# Patient Record
Sex: Male | Born: 1960 | Race: White | Hispanic: No | Marital: Married | Smoking: Never smoker
Health system: Southern US, Community
[De-identification: ages and names within clinical notes are randomized; demographics above are authoritative.]

## PROBLEM LIST (undated history)

## (undated) DIAGNOSIS — F40240 Claustrophobia: Secondary | ICD-10-CM

## (undated) DIAGNOSIS — Z87442 Personal history of urinary calculi: Secondary | ICD-10-CM

## (undated) DIAGNOSIS — K811 Chronic cholecystitis: Secondary | ICD-10-CM

## (undated) DIAGNOSIS — Z973 Presence of spectacles and contact lenses: Secondary | ICD-10-CM

## (undated) DIAGNOSIS — R200 Anesthesia of skin: Secondary | ICD-10-CM

## (undated) HISTORY — PX: CERVICAL SPINE SURGERY: SHX589

## (undated) HISTORY — PX: ROTATOR CUFF REPAIR: SHX139

## (undated) HISTORY — PX: ESOPHAGEAL DILATION: SHX303

---

## 1999-12-18 ENCOUNTER — Ambulatory Visit (HOSPITAL_COMMUNITY): Admission: RE | Admit: 1999-12-18 | Discharge: 1999-12-18 | Payer: Self-pay | Admitting: Gastroenterology

## 1999-12-19 ENCOUNTER — Ambulatory Visit (HOSPITAL_COMMUNITY): Admission: RE | Admit: 1999-12-19 | Discharge: 1999-12-19 | Payer: Self-pay | Admitting: Gastroenterology

## 1999-12-19 ENCOUNTER — Encounter: Payer: Self-pay | Admitting: Gastroenterology

## 2005-08-20 ENCOUNTER — Ambulatory Visit (HOSPITAL_COMMUNITY): Admission: RE | Admit: 2005-08-20 | Discharge: 2005-08-20 | Payer: Self-pay | Admitting: Internal Medicine

## 2005-08-29 ENCOUNTER — Ambulatory Visit (HOSPITAL_COMMUNITY): Admission: RE | Admit: 2005-08-29 | Discharge: 2005-08-29 | Payer: Self-pay | Admitting: Internal Medicine

## 2014-05-06 ENCOUNTER — Other Ambulatory Visit: Payer: Self-pay | Admitting: Orthopaedic Surgery

## 2014-05-07 ENCOUNTER — Other Ambulatory Visit: Payer: Self-pay | Admitting: Orthopaedic Surgery

## 2014-05-07 ENCOUNTER — Ambulatory Visit
Admission: RE | Admit: 2014-05-07 | Discharge: 2014-05-07 | Disposition: A | Discharge status: Home / Self Care | Payer: BC Managed Care – PPO | Source: Ambulatory Visit | Attending: Orthopaedic Surgery | Admitting: Orthopaedic Surgery

## 2014-05-07 VITALS — BP 135/88 | HR 54 | Ht 70.0 in | Wt 190.0 lb

## 2014-05-07 DIAGNOSIS — M5412 Radiculopathy, cervical region: Secondary | ICD-10-CM

## 2014-05-07 DIAGNOSIS — M542 Cervicalgia: Secondary | ICD-10-CM

## 2014-05-07 MED ORDER — DIAZEPAM 5 MG PO TABS
10.0000 mg | ORAL_TABLET | Freq: Once | ORAL | Status: AC
  Administered 2014-05-07: 10 mg via ORAL

## 2014-05-07 MED ORDER — IOHEXOL 300 MG/ML  SOLN
10.0000 mL | Freq: Once | INTRAMUSCULAR | Status: AC | PRN
  Administered 2014-05-07: 10 mL via INTRATHECAL

## 2014-05-07 NOTE — Discharge Instructions (Signed)

## 2016-03-28 ENCOUNTER — Other Ambulatory Visit: Payer: Self-pay | Admitting: Orthopaedic Surgery

## 2016-04-11 NOTE — Pre-Procedure Instructions (Signed)
Corey Phelps  04/11/2016      [facility] - [place] - [address] [address] [place] [address] Phone: [phone] Fax: [phone]    Your procedure is scheduled on Tuesday, August 8th, 2017.  Report to [facility] Admitting at 11:00 A.M.   Call this number if you have problems the morning of surgery:  [phone]   Remember:  Do not eat food or drink liquids after midnight.   Take these medicines the morning of surgery with A SIP OF WATER: None.  7 days prior to surgery, stop taking: Aspirin, NSAIDS, Aleve, Naproxen, Ibuprofen, Advil, Motrin, BC's, Goody's, Fish oil, all herbal medications, and all vitamins.    Do not wear jewelry.  Do not wear lotions, powders, or colognes.    Men may shave face and neck.  Do not bring valuables to the hospital.  [facility] is not responsible for any belongings or valuables.  Contacts, dentures or bridgework may not be worn into surgery.  Leave your suitcase in the car.  After surgery it may be brought to your room.  For patients admitted to the hospital, discharge time will be determined by your treatment team.  Patients discharged the day of surgery will not be allowed to drive home.   Special instructions:  Preparing for Surgery.   Please read over the following fact sheets that you were given. MRSA Information, Incentive Spirometry.   [place]- Preparing For Surgery  Before surgery, you can play an important role. Because skin is not sterile, your skin needs to be as free of germs as possible. You can reduce the number of germs on your skin by washing with CHG (chlorahexidine gluconate) Soap before surgery.  CHG is an antiseptic cleaner which kills germs and bonds with the skin to continue killing germs even after washing.  Please do not use if you have an allergy to CHG or antibacterial soaps. If your skin becomes reddened/irritated stop using  the CHG.  Do not shave (including legs and underarms) for at least 48 hours prior to first CHG shower. It is OK to shave your face.  Please follow these instructions carefully.   1. Shower the NIGHT BEFORE SURGERY and the MORNING OF SURGERY with CHG.   2. If you chose to wash your hair, wash your hair first as usual with your normal shampoo.  3. After you shampoo, rinse your hair and body thoroughly to remove the shampoo.  4. Use CHG as you would any other liquid soap. You can apply CHG directly to the skin and wash gently with a scrungie or a clean washcloth.   5. Apply the CHG Soap to your body ONLY FROM THE NECK DOWN.  Do not use on open wounds or open sores. Avoid contact with your eyes, ears, mouth and genitals (private parts). Wash genitals (private parts) with your normal soap.  6. Wash thoroughly, paying special attention to the area where your surgery will be performed.  7. Thoroughly rinse your body with warm water from the neck down.  8. DO NOT shower/wash with your normal soap after using and rinsing off the CHG Soap.  9. Pat yourself dry with a CLEAN TOWEL.   10. Wear CLEAN PAJAMAS   11. Place CLEAN SHEETS on your bed the night of your first shower and DO NOT SLEEP WITH PETS.  Day of Surgery: Do not apply any deodorants/lotions. Please wear clean clothes  to the hospital/surgery center.

## 2016-04-12 ENCOUNTER — Encounter (HOSPITAL_COMMUNITY): Payer: Self-pay

## 2016-04-12 ENCOUNTER — Ambulatory Visit (HOSPITAL_COMMUNITY)
Admission: RE | Admit: 2016-04-12 | Discharge: 2016-04-12 | Disposition: A | Discharge status: Home / Self Care | Payer: BLUE CROSS/BLUE SHIELD | Source: Ambulatory Visit | Attending: Orthopaedic Surgery | Admitting: Orthopaedic Surgery

## 2016-04-12 ENCOUNTER — Encounter (HOSPITAL_COMMUNITY)
Admission: RE | Admit: 2016-04-12 | Discharge: 2016-04-12 | Disposition: A | Discharge status: Home / Self Care | Payer: BLUE CROSS/BLUE SHIELD | Source: Ambulatory Visit | Attending: Orthopaedic Surgery | Admitting: Orthopaedic Surgery

## 2016-04-12 DIAGNOSIS — Z01812 Encounter for preprocedural laboratory examination: Secondary | ICD-10-CM | POA: Insufficient documentation

## 2016-04-12 DIAGNOSIS — M1612 Unilateral primary osteoarthritis, left hip: Secondary | ICD-10-CM | POA: Insufficient documentation

## 2016-04-12 DIAGNOSIS — Z01818 Encounter for other preprocedural examination: Secondary | ICD-10-CM | POA: Diagnosis present

## 2016-04-12 DIAGNOSIS — Z0183 Encounter for blood typing: Secondary | ICD-10-CM | POA: Insufficient documentation

## 2016-04-12 HISTORY — DX: Personal history of urinary calculi: Z87.442

## 2016-04-12 HISTORY — DX: Claustrophobia: F40.240

## 2016-04-12 HISTORY — DX: Anesthesia of skin: R20.0

## 2016-04-12 HISTORY — DX: Presence of spectacles and contact lenses: Z97.3

## 2016-04-12 LAB — PROTIME-INR
INR: 0.93
Prothrombin Time: 12.5 seconds (ref 11.4–15.2)

## 2016-04-12 LAB — BASIC METABOLIC PANEL
ANION GAP: 8 (ref 5–15)
BUN: 12 mg/dL (ref 6–20)
CHLORIDE: 105 mmol/L (ref 101–111)
CO2: 24 mmol/L (ref 22–32)
Calcium: 9.6 mg/dL (ref 8.9–10.3)
Creatinine, Ser: 1.01 mg/dL (ref 0.61–1.24)
GFR calc Af Amer: >60 mL/min (ref >60)
Glucose, Bld: 96 mg/dL (ref 65–99)
POTASSIUM: 4.4 mmol/L (ref 3.5–5.1)
SODIUM: 137 mmol/L (ref 135–145)

## 2016-04-12 LAB — URINALYSIS, ROUTINE W REFLEX MICROSCOPIC
BILIRUBIN URINE: NEGATIVE
Glucose, UA: NEGATIVE mg/dL
HGB URINE DIPSTICK: NEGATIVE
KETONES UR: NEGATIVE mg/dL
Leukocytes, UA: NEGATIVE
NITRITE: NEGATIVE
Protein, ur: NEGATIVE mg/dL
SPECIFIC GRAVITY, URINE: 1.02 (ref 1.005–1.030)
pH: 5 (ref 5.0–8.0)

## 2016-04-12 LAB — CBC WITH DIFFERENTIAL/PLATELET
BASOS ABS: 0 10*3/uL (ref 0.0–0.1)
Basophils Relative: 0 %
Eosinophils Absolute: 0.5 10*3/uL (ref 0.0–0.7)
Eosinophils Relative: 6 %
HEMATOCRIT: 47.1 % (ref 39.0–52.0)
HEMOGLOBIN: 15.6 g/dL (ref 13.0–17.0)
LYMPHS ABS: 1.3 10*3/uL (ref 0.7–4.0)
LYMPHS PCT: 15 %
MCH: 31.1 pg (ref 26.0–34.0)
MCHC: 33.1 g/dL (ref 30.0–36.0)
MCV: 94 fL (ref 78.0–100.0)
Monocytes Absolute: 0.7 10*3/uL (ref 0.1–1.0)
Monocytes Relative: 9 %
NEUTROS ABS: 5.8 10*3/uL (ref 1.7–7.7)
Neutrophils Relative %: 70 %
PLATELETS: 209 10*3/uL (ref 150–400)
RBC: 5.01 MIL/uL (ref 4.22–5.81)
RDW: 12.7 % (ref 11.5–15.5)
WBC: 8.3 10*3/uL (ref 4.0–10.5)

## 2016-04-12 LAB — SURGICAL PCR SCREEN
MRSA, PCR: NEGATIVE
STAPHYLOCOCCUS AUREUS: POSITIVE — AB

## 2016-04-12 LAB — TYPE AND SCREEN
ABO/RH(D): O POS
ANTIBODY SCREEN: NEGATIVE

## 2016-04-12 LAB — APTT: APTT: 30 s (ref 24–36)

## 2016-04-12 LAB — ABO/RH: ABO/RH(D): O POS

## 2016-04-12 NOTE — Progress Notes (Signed)
PCP - pt. Denies a PCP at this time and states that he goes to Urgent Care at [facility] if needed Cardiologist - denies  EKG - 04/12/16 CXR - 04/12/16  Echo/stress test/Cardiac Cath - denies  Patient denies chest pain and shortness of breath at PAT appointment.

## 2016-04-12 NOTE — Progress Notes (Signed)
Patient notified of positive PCR swab and verbalized understanding.  Prescription called to [facility].

## 2016-04-16 NOTE — H&P (Signed)
TOTAL HIP ADMISSION H&P  Patient is admitted for left total hip arthroplasty.  Subjective:  Chief Complaint: left hip pain  HPI: Corey Phelps, 55 y.o. male, has a history of pain and functional disability in the left hip(s) due to arthritis and patient has failed non-surgical conservative treatments for greater than 12 weeks to include NSAID's and/or analgesics, corticosteriod injections, flexibility and strengthening excercises, use of assistive devices, weight reduction as appropriate and activity modification.  Onset of symptoms was gradual starting 5 years ago with gradually worsening course since that time.The patient noted no past surgery on the left hip(s).  Patient currently rates pain in the left hip at 10 out of 10 with activity. Patient has night pain, worsening of pain with activity and weight bearing, trendelenberg gait, pain that interfers with activities of daily living and crepitus. Patient has evidence of subchondral cysts, subchondral sclerosis, periarticular osteophytes and joint space narrowing by imaging studies. This condition presents safety issues increasing the risk of falls.  There is no current active infection.  There are no active problems to display for this patient.  Past Medical History:  Diagnosis Date  . Claustrophobia   . History of kidney stones   . Numbness    left leg to toes   . Wears glasses     Past Surgical History:  Procedure Laterality Date  . CERVICAL SPINE SURGERY    . ESOPHAGEAL DILATION    . ROTATOR CUFF REPAIR Right     No prescriptions prior to admission.   Allergies  Allergen Reactions  . Erythromycin Hives and Rash    Social History  Substance Use Topics  . Smoking status: Never Smoker  . Smokeless tobacco: Current User    Types: Snuff, Chew  . Alcohol use 1.2 - 2.4 oz/week    2 - 4 Cans of beer per week     Comment: occas.     No family history on file.   Review of Systems  Musculoskeletal: Positive for joint pain.        Left hip    Objective:  Physical Exam  Constitutional: He is oriented to person, place, and time. He appears well-developed and well-nourished.  HENT:  Head: Normocephalic and atraumatic.  Eyes: Pupils are equal, round, and reactive to light.  Neck: Normal range of motion.  Cardiovascular: Normal rate and regular rhythm.   Respiratory: Effort normal.  GI: Soft.  Musculoskeletal:  Left hip motion is good though extremely painful in internal rotation. Leg lengths are roughly equal. He walks with a mildly altered gait. Sensation and motor function are intact in his feet with palpable pulses on both sides. Straight leg raise is negative.   Neurological: He is alert and oriented to person, place, and time.  Skin: Skin is warm and dry.  Psychiatric: He has a normal mood and affect. His behavior is normal. Judgment and thought content normal.    Vital signs in last 24 hours:    Labs:   Estimated body mass index is 28.69 kg/m as calculated from the following:   Height as of 04/12/16:  (1.778 m).   Weight as of 04/12/16: 90.7 kg (199 lb 15.3 oz).   Imaging Review Plain radiographs demonstrate severe degenerative joint disease of the left hip(s). The bone quality appears to be good for age and reported activity level.  Assessment/Plan:  End stage primary arthritis, left hip(s)  The patient history, physical examination, clinical judgement of the provider and imaging studies are consistent  with end stage degenerative joint disease of the left hip(s) and total hip arthroplasty is deemed medically necessary. The treatment options including medical management, injection therapy, arthroscopy and arthroplasty were discussed at length. The risks and benefits of total hip arthroplasty were presented and reviewed. The risks due to aseptic loosening, infection, stiffness, dislocation/subluxation,  thromboembolic complications and other imponderables were discussed.  The patient  acknowledged the explanation, agreed to proceed with the plan and consent was signed. Patient is being admitted for inpatient treatment for surgery, pain control, PT, OT, prophylactic antibiotics, VTE prophylaxis, progressive ambulation and ADL's and discharge planning.The patient is planning to be discharged home with home health services

## 2016-04-24 ENCOUNTER — Inpatient Hospital Stay (HOSPITAL_COMMUNITY): Payer: BLUE CROSS/BLUE SHIELD | Admitting: Certified Registered"

## 2016-04-24 ENCOUNTER — Inpatient Hospital Stay (HOSPITAL_COMMUNITY): Payer: BLUE CROSS/BLUE SHIELD

## 2016-04-24 ENCOUNTER — Inpatient Hospital Stay (HOSPITAL_COMMUNITY)
Admission: RE | Admit: 2016-04-24 | Discharge: 2016-04-25 | DRG: 470 | Disposition: A | Discharge status: Home Health | Payer: BLUE CROSS/BLUE SHIELD | Source: Ambulatory Visit | Attending: Orthopaedic Surgery | Admitting: Orthopaedic Surgery

## 2016-04-24 ENCOUNTER — Encounter (HOSPITAL_COMMUNITY): Payer: Self-pay | Admitting: Urology

## 2016-04-24 ENCOUNTER — Encounter (HOSPITAL_COMMUNITY)
Admission: RE | Disposition: A | Discharge status: Home Health | Payer: Self-pay | Source: Ambulatory Visit | Attending: Orthopaedic Surgery

## 2016-04-24 DIAGNOSIS — M25552 Pain in left hip: Secondary | ICD-10-CM | POA: Diagnosis present

## 2016-04-24 DIAGNOSIS — F17220 Nicotine dependence, chewing tobacco, uncomplicated: Secondary | ICD-10-CM | POA: Diagnosis present

## 2016-04-24 DIAGNOSIS — M1612 Unilateral primary osteoarthritis, left hip: Secondary | ICD-10-CM | POA: Diagnosis present

## 2016-04-24 DIAGNOSIS — Z419 Encounter for procedure for purposes other than remedying health state, unspecified: Secondary | ICD-10-CM

## 2016-04-24 HISTORY — PX: TOTAL HIP ARTHROPLASTY: SHX124

## 2016-04-24 SURGERY — ARTHROPLASTY, HIP, TOTAL, ANTERIOR APPROACH
Anesthesia: Spinal | Site: Hip | Laterality: Left

## 2016-04-24 MED ORDER — ACETAMINOPHEN 325 MG PO TABS: 325.0000 mg | ORAL_TABLET | ORAL | Status: DC | PRN

## 2016-04-24 MED ORDER — METRONIDAZOLE 0.75 % EX CREA: 1.0000 "application " | TOPICAL_CREAM | Freq: Every day | CUTANEOUS | Status: DC | PRN

## 2016-04-24 MED ORDER — OXYCODONE HCL 5 MG PO TABS
5.0000 mg | ORAL_TABLET | Freq: Once | ORAL | Status: AC | PRN
  Administered 2016-04-24: 5 mg via ORAL

## 2016-04-24 MED ORDER — ONDANSETRON HCL 4 MG/2ML IJ SOLN
INTRAMUSCULAR | Status: DC | PRN
  Administered 2016-04-24: 4 mg via INTRAVENOUS

## 2016-04-24 MED ORDER — METHOCARBAMOL 1000 MG/10ML IJ SOLN
500.0000 mg | Freq: Four times a day (QID) | INTRAVENOUS | Status: DC | PRN
  Filled 2016-04-24: qty 5

## 2016-04-24 MED ORDER — BUPIVACAINE LIPOSOME 1.3 % IJ SUSP
20.0000 mL | Freq: Once | INTRAMUSCULAR | Status: DC
  Filled 2016-04-24: qty 20

## 2016-04-24 MED ORDER — OXYCODONE HCL 5 MG/5ML PO SOLN: 5.0000 mg | Freq: Once | ORAL | Status: AC | PRN

## 2016-04-24 MED ORDER — PHENOL 1.4 % MT LIQD: 1.0000 | OROMUCOSAL | Status: DC | PRN

## 2016-04-24 MED ORDER — LACTATED RINGERS IV SOLN
INTRAVENOUS | Status: DC
  Administered 2016-04-24: 18:00:00 via INTRAVENOUS

## 2016-04-24 MED ORDER — HYDROCODONE-ACETAMINOPHEN 5-325 MG PO TABS
1.0000 | ORAL_TABLET | ORAL | Status: DC | PRN
  Administered 2016-04-24 – 2016-04-25 (×5): 2 via ORAL
  Filled 2016-04-24 (×5): qty 2

## 2016-04-24 MED ORDER — DOCUSATE SODIUM 100 MG PO CAPS
100.0000 mg | ORAL_CAPSULE | Freq: Two times a day (BID) | ORAL | Status: DC
  Administered 2016-04-24 – 2016-04-25 (×2): 100 mg via ORAL
  Filled 2016-04-24 (×2): qty 1

## 2016-04-24 MED ORDER — ACETAMINOPHEN 650 MG RE SUPP
650.0000 mg | Freq: Four times a day (QID) | RECTAL | Status: DC | PRN
Start: 2016-04-24 — End: 2016-04-25

## 2016-04-24 MED ORDER — TRANEXAMIC ACID 1000 MG/10ML IV SOLN
1000.0000 mg | INTRAVENOUS | Status: AC
  Administered 2016-04-24: 1000 mg via INTRAVENOUS
  Filled 2016-04-24: qty 10

## 2016-04-24 MED ORDER — FENTANYL CITRATE (PF) 250 MCG/5ML IJ SOLN
INTRAMUSCULAR | Status: AC
  Filled 2016-04-24: qty 5

## 2016-04-24 MED ORDER — EPHEDRINE SULFATE 50 MG/ML IJ SOLN
INTRAMUSCULAR | Status: DC | PRN
Start: 2016-04-24 — End: 2016-04-24
  Administered 2016-04-24: 10 mg via INTRAVENOUS

## 2016-04-24 MED ORDER — ONDANSETRON HCL 4 MG PO TABS: 4.0000 mg | ORAL_TABLET | Freq: Four times a day (QID) | ORAL | Status: DC | PRN

## 2016-04-24 MED ORDER — PROPOFOL 10 MG/ML IV BOLUS
INTRAVENOUS | Status: DC | PRN
  Administered 2016-04-24: 10 mg via INTRAVENOUS

## 2016-04-24 MED ORDER — PROPOFOL 10 MG/ML IV BOLUS
INTRAVENOUS | Status: AC
  Filled 2016-04-24: qty 20

## 2016-04-24 MED ORDER — LACTATED RINGERS IV SOLN: INTRAVENOUS | Status: DC

## 2016-04-24 MED ORDER — CEFAZOLIN SODIUM-DEXTROSE 2-4 GM/100ML-% IV SOLN
2.0000 g | Freq: Four times a day (QID) | INTRAVENOUS | Status: AC
  Administered 2016-04-24 – 2016-04-25 (×2): 2 g via INTRAVENOUS
  Filled 2016-04-24 (×2): qty 100

## 2016-04-24 MED ORDER — HYDROMORPHONE HCL 1 MG/ML IJ SOLN: 0.2500 mg | INTRAMUSCULAR | Status: DC | PRN

## 2016-04-24 MED ORDER — ALUM & MAG HYDROXIDE-SIMETH 200-200-20 MG/5ML PO SUSP: 30.0000 mL | ORAL | Status: DC | PRN

## 2016-04-24 MED ORDER — TRANEXAMIC ACID 1000 MG/10ML IV SOLN
INTRAVENOUS | Status: DC | PRN
  Administered 2016-04-24: 2000 mg via TOPICAL

## 2016-04-24 MED ORDER — MIDAZOLAM HCL 2 MG/2ML IJ SOLN
INTRAMUSCULAR | Status: AC
  Filled 2016-04-24: qty 2

## 2016-04-24 MED ORDER — MENTHOL 3 MG MT LOZG: 1.0000 | LOZENGE | OROMUCOSAL | Status: DC | PRN

## 2016-04-24 MED ORDER — EPHEDRINE 5 MG/ML INJ
INTRAVENOUS | Status: AC
  Filled 2016-04-24: qty 10

## 2016-04-24 MED ORDER — DEXAMETHASONE SODIUM PHOSPHATE 10 MG/ML IJ SOLN
INTRAMUSCULAR | Status: AC
  Filled 2016-04-24: qty 1

## 2016-04-24 MED ORDER — PHENYLEPHRINE HCL 10 MG/ML IJ SOLN
INTRAVENOUS | Status: DC | PRN
  Administered 2016-04-24: 15 ug/min via INTRAVENOUS

## 2016-04-24 MED ORDER — CEFAZOLIN SODIUM-DEXTROSE 2-4 GM/100ML-% IV SOLN
2.0000 g | INTRAVENOUS | Status: AC
  Administered 2016-04-24: 2 g via INTRAVENOUS

## 2016-04-24 MED ORDER — BUPIVACAINE-EPINEPHRINE (PF) 0.25% -1:200000 IJ SOLN
INTRAMUSCULAR | Status: AC
  Filled 2016-04-24: qty 30

## 2016-04-24 MED ORDER — ASPIRIN EC 325 MG PO TBEC
325.0000 mg | DELAYED_RELEASE_TABLET | Freq: Two times a day (BID) | ORAL | Status: DC
  Administered 2016-04-25: 325 mg via ORAL
  Filled 2016-04-24: qty 1

## 2016-04-24 MED ORDER — CEFAZOLIN SODIUM-DEXTROSE 2-4 GM/100ML-% IV SOLN
INTRAVENOUS | Status: AC
  Filled 2016-04-24: qty 100

## 2016-04-24 MED ORDER — LIDOCAINE 2% (20 MG/ML) 5 ML SYRINGE
INTRAMUSCULAR | Status: AC
  Filled 2016-04-24: qty 5

## 2016-04-24 MED ORDER — BUPIVACAINE HCL (PF) 0.5 % IJ SOLN
INTRAMUSCULAR | Status: AC
  Filled 2016-04-24: qty 30

## 2016-04-24 MED ORDER — MIDAZOLAM HCL 5 MG/5ML IJ SOLN
INTRAMUSCULAR | Status: DC | PRN
  Administered 2016-04-24: 2 mg via INTRAVENOUS
  Administered 2016-04-24 (×2): 1 mg via INTRAVENOUS

## 2016-04-24 MED ORDER — METHOCARBAMOL 500 MG PO TABS
500.0000 mg | ORAL_TABLET | Freq: Four times a day (QID) | ORAL | Status: DC | PRN
  Administered 2016-04-24: 500 mg via ORAL
  Filled 2016-04-24: qty 1

## 2016-04-24 MED ORDER — BUPIVACAINE HCL (PF) 0.25 % IJ SOLN
INTRAMUSCULAR | Status: AC
  Filled 2016-04-24: qty 30

## 2016-04-24 MED ORDER — BUPIVACAINE-EPINEPHRINE 0.25% -1:200000 IJ SOLN
INTRAMUSCULAR | Status: DC | PRN
  Administered 2016-04-24: 20 mL

## 2016-04-24 MED ORDER — ZOLPIDEM TARTRATE 5 MG PO TABS
5.0000 mg | ORAL_TABLET | Freq: Every evening | ORAL | Status: DC | PRN
  Administered 2016-04-25: 5 mg via ORAL
  Filled 2016-04-24: qty 1

## 2016-04-24 MED ORDER — CHLORHEXIDINE GLUCONATE 4 % EX LIQD: 60.0000 mL | Freq: Once | CUTANEOUS | Status: DC

## 2016-04-24 MED ORDER — ACETAMINOPHEN 160 MG/5ML PO SOLN
325.0000 mg | ORAL | Status: DC | PRN
  Filled 2016-04-24: qty 20.3

## 2016-04-24 MED ORDER — ONDANSETRON HCL 4 MG/2ML IJ SOLN
INTRAMUSCULAR | Status: AC
  Filled 2016-04-24: qty 2

## 2016-04-24 MED ORDER — OXYCODONE HCL 5 MG PO TABS
ORAL_TABLET | ORAL | Status: AC
  Filled 2016-04-24: qty 1

## 2016-04-24 MED ORDER — DEXAMETHASONE SODIUM PHOSPHATE 10 MG/ML IJ SOLN
INTRAMUSCULAR | Status: DC | PRN
  Administered 2016-04-24: 10 mg via INTRAVENOUS

## 2016-04-24 MED ORDER — HYDROMORPHONE HCL 1 MG/ML IJ SOLN: 0.5000 mg | INTRAMUSCULAR | Status: DC | PRN

## 2016-04-24 MED ORDER — 0.9 % SODIUM CHLORIDE (POUR BTL) OPTIME
TOPICAL | Status: DC | PRN
  Administered 2016-04-24: 1000 mL

## 2016-04-24 MED ORDER — ACETAMINOPHEN 325 MG PO TABS: 650.0000 mg | ORAL_TABLET | Freq: Four times a day (QID) | ORAL | Status: DC | PRN

## 2016-04-24 MED ORDER — PROPOFOL 500 MG/50ML IV EMUL
INTRAVENOUS | Status: DC | PRN
  Administered 2016-04-24: 65 ug/kg/min via INTRAVENOUS

## 2016-04-24 MED ORDER — BUPIVACAINE HCL (PF) 0.5 % IJ SOLN
INTRAMUSCULAR | Status: DC | PRN
  Administered 2016-04-24: 3 mL

## 2016-04-24 MED ORDER — LACTATED RINGERS IV SOLN
INTRAVENOUS | Status: DC
  Administered 2016-04-24 (×2): via INTRAVENOUS

## 2016-04-24 MED ORDER — FENTANYL CITRATE (PF) 100 MCG/2ML IJ SOLN
INTRAMUSCULAR | Status: DC | PRN
  Administered 2016-04-24: 100 ug via INTRAVENOUS

## 2016-04-24 MED ORDER — BUPIVACAINE LIPOSOME 1.3 % IJ SUSP
INTRAMUSCULAR | Status: DC | PRN
  Administered 2016-04-24: 20 mL

## 2016-04-24 MED ORDER — TRANEXAMIC ACID 1000 MG/10ML IV SOLN
2000.0000 mg | Freq: Once | INTRAVENOUS | Status: DC
  Filled 2016-04-24: qty 20

## 2016-04-24 MED ORDER — DIPHENHYDRAMINE HCL 12.5 MG/5ML PO ELIX: 12.5000 mg | ORAL_SOLUTION | ORAL | Status: DC | PRN

## 2016-04-24 MED ORDER — BISACODYL 5 MG PO TBEC: 5.0000 mg | DELAYED_RELEASE_TABLET | Freq: Every day | ORAL | Status: DC | PRN

## 2016-04-24 MED ORDER — ONDANSETRON HCL 4 MG/2ML IJ SOLN
4.0000 mg | Freq: Four times a day (QID) | INTRAMUSCULAR | Status: DC | PRN
  Filled 2016-04-24: qty 2

## 2016-04-24 MED ORDER — BUPIVACAINE HCL (PF) 0.5 % IJ SOLN
INTRAMUSCULAR | Status: DC | PRN
  Administered 2016-04-24: 3 mL via INTRATHECAL

## 2016-04-24 SURGICAL SUPPLY — 52 items
BAG DECANTER FOR FLEXI CONT (MISCELLANEOUS) ×3 IMPLANT
BENZOIN TINCTURE PRP APPL 2/3 (GAUZE/BANDAGES/DRESSINGS) ×3 IMPLANT
BLADE SAW SGTL 18X1.27X75 (BLADE) ×2 IMPLANT
BLADE SAW SGTL 18X1.27X75MM (BLADE) ×1
BLADE SURG ROTATE 9660 (MISCELLANEOUS) IMPLANT
CAPT HIP TOTAL 2 ×3 IMPLANT
CELLS DAT CNTRL 66122 CELL SVR (MISCELLANEOUS) ×1 IMPLANT
CLOSURE WOUND 1/2 X4 (GAUZE/BANDAGES/DRESSINGS) ×1
COVER PERINEAL POST (MISCELLANEOUS) ×3 IMPLANT
COVER SURGICAL LIGHT HANDLE (MISCELLANEOUS) ×3 IMPLANT
DRAPE C-ARM 42X72 X-RAY (DRAPES) ×3 IMPLANT
DRAPE IMP U-DRAPE 54X76 (DRAPES) ×3 IMPLANT
DRAPE STERI IOBAN 125X83 (DRAPES) ×3 IMPLANT
DRAPE U-SHAPE 47X51 STRL (DRAPES) ×9 IMPLANT
DRSG AQUACEL AG ADV 3.5X10 (GAUZE/BANDAGES/DRESSINGS) ×3 IMPLANT
DURAPREP 26ML APPLICATOR (WOUND CARE) ×3 IMPLANT
ELECT BLADE 4.0 EZ CLEAN MEGAD (MISCELLANEOUS)
ELECT CAUTERY BLADE 6.4 (BLADE) ×3 IMPLANT
ELECT REM PT RETURN 9FT ADLT (ELECTROSURGICAL) ×3
ELECTRODE BLDE 4.0 EZ CLN MEGD (MISCELLANEOUS) IMPLANT
ELECTRODE REM PT RTRN 9FT ADLT (ELECTROSURGICAL) ×1 IMPLANT
FACESHIELD WRAPAROUND (MASK) ×6 IMPLANT
GLOVE BIO SURGEON STRL SZ8 (GLOVE) ×6 IMPLANT
GLOVE BIOGEL PI IND STRL 8 (GLOVE) ×2 IMPLANT
GLOVE BIOGEL PI INDICATOR 8 (GLOVE) ×4
GOWN STRL REUS W/ TWL LRG LVL3 (GOWN DISPOSABLE) ×2 IMPLANT
GOWN STRL REUS W/ TWL XL LVL3 (GOWN DISPOSABLE) ×2 IMPLANT
GOWN STRL REUS W/TWL LRG LVL3 (GOWN DISPOSABLE) ×4
GOWN STRL REUS W/TWL XL LVL3 (GOWN DISPOSABLE) ×4
KIT BASIN OR (CUSTOM PROCEDURE TRAY) ×3 IMPLANT
KIT ROOM TURNOVER OR (KITS) ×3 IMPLANT
MANIFOLD NEPTUNE II (INSTRUMENTS) ×3 IMPLANT
NEEDLE HYPO 22GX1.5 SAFETY (NEEDLE) ×3 IMPLANT
NS IRRIG 1000ML POUR BTL (IV SOLUTION) ×3 IMPLANT
PACK TOTAL JOINT (CUSTOM PROCEDURE TRAY) ×3 IMPLANT
PAD ARMBOARD 7.5X6 YLW CONV (MISCELLANEOUS) ×6 IMPLANT
RTRCTR WOUND ALEXIS 18CM MED (MISCELLANEOUS) ×3
STAPLER VISISTAT 35W (STAPLE) IMPLANT
STRIP CLOSURE SKIN 1/2X4 (GAUZE/BANDAGES/DRESSINGS) ×2 IMPLANT
SUT ETHIBOND NAB CT1 #1 30IN (SUTURE) ×9 IMPLANT
SUT VIC AB 0 CT1 27 (SUTURE)
SUT VIC AB 0 CT1 27XBRD ANBCTR (SUTURE) IMPLANT
SUT VIC AB 1 CT1 27 (SUTURE) ×2
SUT VIC AB 1 CT1 27XBRD ANBCTR (SUTURE) ×1 IMPLANT
SUT VIC AB 2-0 CT1 27 (SUTURE) ×2
SUT VIC AB 2-0 CT1 TAPERPNT 27 (SUTURE) ×1 IMPLANT
SUT VLOC 180 0 24IN GS25 (SUTURE) ×3 IMPLANT
SYR 50ML LL SCALE MARK (SYRINGE) ×3 IMPLANT
TOWEL OR 17X24 6PK STRL BLUE (TOWEL DISPOSABLE) ×3 IMPLANT
TOWEL OR 17X26 10 PK STRL BLUE (TOWEL DISPOSABLE) ×6 IMPLANT
TRAY FOLEY CATH 14FR (SET/KITS/TRAYS/PACK) IMPLANT
WATER STERILE IRR 1000ML POUR (IV SOLUTION) ×6 IMPLANT

## 2016-04-24 NOTE — Interval H&P Note (Signed)
History and Physical Interval Note:  04/24/2016 9:50 AM  Corey Phelps  has presented today for surgery, with the diagnosis of LEFT HIP DEGENERATIVE JOINT DISEASE  The various methods of treatment have been discussed with the patient and family. After consideration of risks, benefits and other options for treatment, the patient has consented to  Procedure(s) with comments: TOTAL HIP ARTHROPLASTY ANTERIOR APPROACH (Left) - Request a First Assist as a surgical intervention .  The patient's history has been reviewed, patient examined, no change in status, stable for surgery.  I have reviewed the patient's chart and labs.  Questions were answered to the patient's satisfaction.     Taeveon Keesling G

## 2016-04-24 NOTE — Op Note (Signed)
PRE-OP DIAGNOSIS:  LEFT HIP DEGENERATIVE JOINT DISEASE POST-OP DIAGNOSIS: same PROCEDURE:  LEFT TOTAL HIP ARTHROPLASTY ANTERIOR APPROACH ANESTHESIA:  Spinal and MAC SURGEON:  Marcene CorningPeter Lyvia Mondesir MD ASSISTANT:  Garwin BrothersKarla Bethune RNFA   INDICATIONS FOR PROCEDURE:  The patient is a 55 y.o. male with a long history of a painful hip.  This has persisted despite multiple conservative measures.  The patient has persisted with pain and dysfunction making rest and activity difficult.  A total hip replacement is offered as surgical treatment.  Informed operative consent was obtained after discussion of possible complications including reaction to anesthesia, infection, neurovascular injury, dislocation, DVT, PE, and death.  The importance of the postoperative rehab program to optimize result was stressed with the patient.  SUMMARY OF FINDINGS AND PROCEDURE:  Under general anesthesia through a anterior approach an the Hana table a left THR was performed.  The patient had severe degenerative change and excellent bone quality.  We used DePuy components to replace the hip and these were size KA 13 Corail femur capped with a +8.5 36 mm ceramic hip ball.  On the acetabular side we used a size 52 Gription shell with a plus 0 neutral polyethylene liner.  We did use a hole eliminator.  Garwin BrothersKarla Bethune RNFA assisted throughout the procedure.  She also closed simultaneously to help minimize OR time.  I used fluoroscopy throughout the case to check position of components and leg lengths and read all these views myself.  DESCRIPTION OF PROCEDURE:  The patient was taken to the OR suite where general anesthetic was applied.  The patient was then positioned on the Hana table supine.  All bony prominences were appropriately padded.  Prep and drape was then performed in normal sterile fashion.  The patient was given kefzol preoperative antibiotic and an appropriate time out was performed.  We then took an anterior approach to the left hip.   Dissection was taken through adipose to the tensor fascia lata fascia.  This structure was incised longitudinally and we dissected in the intermuscular interval just medial to this muscle.  Cobra retractors were placed superior and inferior to the femoral neck superficial to the capsule.  A capsular incision was then made and the retractors were placed along the femoral neck.  Xray was brought in to get a good level for the femoral neck cut which was made with an oscillating saw and osteotome.  The femoral head was removed with a corkscrew.  The acetabulum was exposed and some labral tissues were excised. Reaming was taken to the inside wall of the pelvis and sequentially up to 1 mm smaller than the actual component.  A trial of components was done and then the aforementioned acetabular shell was placed in appropriate tilt and anteversion confirmed by fluoroscopy. The liner was placed along with the hole eliminator and attention was turned to the femur.  The leg was brought down and over into adduction and the elevator bar was used to raise the femur up gently in the wound.  The piriformis was released with care taken to preserve the obturator internus attachment and all of the posterior capsule. The femur was reamed and then broached to the appropriate size.  A trial reduction was done and the aforementioned head and neck assembly gave us the best stability in extension with external rotation.  Leg lengths were felt to be about equal by fluoroscopic exam.  The trial components were removed and the wound irrigated.  We then placed the femoral component in  appropriate anteversion.  The head was applied to a dry stem neck and the hip again reduced.  It was again stable in the aforementioned position.  The would was irrigated again followed by re-approximation of anterior capsule with ethibond suture. Tensor fascia was repaired with V-loc suture  followed by deep closure with #O and #2 undyed vicryl.  Skin was closed  with subQ stitch and steristrips followed by a sterile dressing.  EBL and IOF can be obtained from anesthesia records.  DISPOSITION:  The patient was extubated in the OR and taken to PACU in stable condition to be admitted to the Orthopedic Surgery for appropriate post-op care to include perioperative antibiotics and DVT prophylaxis.

## 2016-04-24 NOTE — Anesthesia Procedure Notes (Signed)
Procedure Name: MAC Date/Time: 04/24/2016 10:26 AM Performed by: Doyce LooseHOFFMAN, Vincy Feliz ANN Pre-anesthesia Checklist: Patient identified, Emergency Drugs available, Suction available and Patient being monitored Patient Re-evaluated:Patient Re-evaluated prior to inductionOxygen Delivery Method: Nasal cannula

## 2016-04-24 NOTE — Transfer of Care (Signed)
Immediate Anesthesia Transfer of Care Note  Patient: Corey Phelps  Procedure(s) Performed: Procedure(s): LEFT TOTAL HIP ARTHROPLASTY ANTERIOR APPROACH (Left)  Patient Location: PACU  Anesthesia Type:Spinal  Level of Consciousness: sedated  Airway & Oxygen Therapy: Patient Spontanous Breathing and Patient connected to nasal cannula oxygen  Post-op Assessment: Report given to RN and Post -op Vital signs reviewed and stable  Post vital signs: Reviewed and stable  Last Vitals:  Vitals:   04/24/16 0929  BP: (!) 133/97  Pulse: 61  Resp: 18  Temp: 36.6 C    Last Pain:  Vitals:   04/24/16 0929  TempSrc: Oral  PainSc:          Complications: No apparent anesthesia complications

## 2016-04-24 NOTE — Anesthesia Preprocedure Evaluation (Signed)
Anesthesia Evaluation  Patient identified by MRN, date of birth, ID band Patient awake    Reviewed: Allergy & Precautions, NPO status , Patient's Chart, lab work & pertinent test results  History of Anesthesia Complications Negative for: history of anesthetic complications  Airway Mallampati: II  TM Distance: >3 FB Neck ROM: Full    Dental  (+) Teeth Intact   Pulmonary neg pulmonary ROS,    breath sounds clear to auscultation       Cardiovascular negative cardio ROS   Rhythm:Regular     Neuro/Psych PSYCHIATRIC DISORDERS Anxiety negative neurological ROS     GI/Hepatic negative GI ROS, Neg liver ROS,   Endo/Other  negative endocrine ROS  Renal/GU negative Renal ROS     Musculoskeletal   Abdominal   Peds  Hematology negative hematology ROS (+)   Anesthesia Other Findings   Reproductive/Obstetrics                             Anesthesia Physical Anesthesia Plan  ASA: I  Anesthesia Plan: Spinal   Post-op Pain Management:    Induction: Intravenous  Airway Management Planned: Natural Airway, Nasal Cannula and Simple Face Mask  Additional Equipment: None  Intra-op Plan:   Post-operative Plan:   Informed Consent: I have reviewed the patients History and Physical, chart, labs and discussed the procedure including the risks, benefits and alternatives for the proposed anesthesia with the patient or authorized representative who has indicated his/her understanding and acceptance.   Dental advisory given  Plan Discussed with: CRNA and Surgeon  Anesthesia Plan Comments:         Anesthesia Quick Evaluation

## 2016-04-24 NOTE — Evaluation (Signed)
Physical Therapy Evaluation Patient Details Name: Corey Phelps MRN: [id number] DOB: [birth year] Today's Date: 04/24/2016   History of Present Illness  55 y.o. male admitted to [facility] on 04/24/16 for elective L THA.  Pt with significant PMHx of numbness in left leg to toes, claustrophobia, R RTC repair, and cervical spine surgery.    Clinical Impression  Pt is POD #0 and moving well.  He was able to get up to chair, but unable to safely walk yet as his spinal has not completely worn off yet.  Pt will likely progress well.   PT to follow acutely for deficits listed below.       Follow Up Recommendations Home health PT;Supervision for mobility/OOB    Equipment Recommendations  None recommended by PT    Recommendations for Other Services   NA    Precautions / Restrictions Restrictions Weight Bearing Restrictions: Yes LLE Weight Bearing: Weight bearing as tolerated      Mobility  Bed Mobility Overal bed mobility: Needs Assistance Bed Mobility: Supine to Sit     Supine to sit: HOB elevated;Min assist     General bed mobility comments: Min assist to help progress left leg over EOB. Pt using railing for leverage.   Transfers Overall transfer level: Needs assistance Equipment used: Rolling walker (2 wheeled) Transfers: Sit to/from [employer] Sit to Stand: Min assist   Squat pivot transfers: Min assist     General transfer comment: Min assist to block right leg and stabilize RW verbal cues for safe hand placement.  knee blocked due to pt not completely worn off his spinal.  Pt was able to stabilize against the bed and pull his shorts up.  PT did not feel safe with him transferring with RW yet.  Squat pivot was safer to get to the recliner chair tonight and pt did this easily with min assit for safety to ensure he made it to the recliner chair.          Balance Overall balance assessment: Needs assistance Sitting-balance support: Feet supported;No upper  extremity supported Sitting balance-Leahy Scale: Good     Standing balance support: Bilateral upper extremity supported Standing balance-Leahy Scale: Poor                               Pertinent Vitals/Pain Pain Assessment: 0-10 Pain Score: 5  Pain Location: left hip when trying to move Pain Descriptors / Indicators:  (pulling) Pain Intervention(s): Limited activity within patient's tolerance;Monitored during session;Repositioned    Home Living Family/patient expects to be discharged to:: Private residence Living Arrangements: Spouse/significant other Available Help at Discharge: Family;Available 24 hours/day Type of Home: House Home Access: Stairs to enter Entrance Stairs-Rails: Right;Left;Can reach both Entrance Stairs-Number of Steps: 4 Home Layout: One level Home Equipment: Walker - 2 wheels;Cane - single point;Bedside commode;Shower seat - built in;Wheelchair - manual      Prior Function Level of Independence: Independent         Comments: works, farming     Higher education careers adviser   Dominant Hand: Right    Extremity/Trunk Assessment   Upper Extremity Assessment: Overall WFL for tasks assessed           Lower Extremity Assessment: RLE deficits/detail;LLE deficits/detail RLE Deficits / Details: Pt still with numbness and weakness in bil legs from surgery.  I anticipate he will get full strength back, but currently trace ankle PF/DF, 3-/5 knee, 3-/5 hip LLE Deficits /  Details: pt with numbness and weakness from surgery, pt with trace ankle PF/DF, 2/5 knee, 2/5 hip  Cervical / Trunk Assessment: Other exceptions  Communication   Communication: No difficulties  Cognition Arousal/Alertness: Awake/alert Behavior During Therapy: WFL for tasks assessed/performed Overall Cognitive Status: Within Functional Limits for tasks assessed                         Exercises Total Joint Exercises Ankle Circles/Pumps: AAROM;Both;20 reps Other  Exercises Other Exercises: IS x5 reps, verbal cues to slow speed, pt able to get to the top of the spirometer.       Assessment/Plan    PT Assessment Patient needs continued PT services  PT Diagnosis Difficulty walking;Abnormality of gait;Generalized weakness;Acute pain   PT Problem List Decreased strength;Decreased range of motion;Decreased activity tolerance;Decreased balance;Decreased mobility;Decreased knowledge of use of DME;Pain  PT Treatment Interventions DME instruction;Gait training;Stair training;Functional mobility training;Therapeutic activities;Therapeutic exercise;Balance training;Neuromuscular re-education;Cognitive remediation;Patient/family education;Modalities;Manual techniques   PT Goals (Current goals can be found in the Care Plan section) Acute Rehab PT Goals Patient Stated Goal: to go home, get back to normal PT Goal Formulation: With patient Time For Goal Achievement: 05/01/16 Potential to Achieve Goals: Good    Frequency 7X/week           End of Session   Activity Tolerance: Patient tolerated treatment well Patient left: in chair;with call bell/phone within reach;with family/visitor present Nurse Communication: Mobility status         Time: [phone] (only charged one unit due to lots of chatting) PT Time Calculation (min) (ACUTE ONLY): 24 min   Charges:   PT Evaluation $PT Eval Moderate Complexity: 1 Procedure          Kourtnee Lahey B. Kalan Rinn, PT, DPT [phone]   04/24/2016, 6:20 PM

## 2016-04-25 ENCOUNTER — Encounter (HOSPITAL_COMMUNITY): Payer: Self-pay | Admitting: Orthopaedic Surgery

## 2016-04-25 MED ORDER — ASPIRIN 325 MG PO TBEC: 325.0000 mg | DELAYED_RELEASE_TABLET | Freq: Two times a day (BID) | ORAL | 0 refills | Status: DC

## 2016-04-25 MED ORDER — METHOCARBAMOL 500 MG PO TABS: 500.0000 mg | ORAL_TABLET | Freq: Four times a day (QID) | ORAL | 0 refills | Status: DC | PRN

## 2016-04-25 NOTE — Progress Notes (Signed)
Physical Therapy Treatment Patient Details Name: Corey FuellingMichael S Kriesel MRN: [id number] DOB: 1961-07-11 Today's Date: 04/25/2016    History of Present Illness 55 y.o. male admitted to [facility] on 04/24/16 for elective L THA.  Pt with significant PMHx of numbness in left leg to toes, claustrophobia, R RTC repair, and cervical spine surgery.      PT Comments    Pt performed increased mobility and ready for d/c home from a mobility stand point.  Will f/u in pm if patient remains hospitalized.    Follow Up Recommendations  Home health PT;Supervision for mobility/OOB     Equipment Recommendations  None recommended by PT    Recommendations for Other Services       Precautions / Restrictions Precautions Precautions: None Restrictions Weight Bearing Restrictions: Yes LLE Weight Bearing: Weight bearing as tolerated    Mobility  Bed Mobility               General bed mobility comments: Pt sitting in chair on arrival.    Transfers Overall transfer level: Needs assistance Equipment used: Rolling walker (2 wheeled) Transfers: Sit to/from Stand Sit to Stand: Supervision   Squat pivot transfers: Supervision     General transfer comment: pt impulsive to stand but no instability noted.  Pt demonstrates good technique.    Ambulation/Gait Ambulation/Gait assistance: Supervision Ambulation Distance (Feet): 250 Feet Assistive device: Rolling walker (2 wheeled) Gait Pattern/deviations: Step-through pattern;Trunk flexed     General Gait Details: Cues for upright posture and to push RW vs. picking up.     Stairs    4 stairs with B rails and min guard assist.  Cues for sequencing and hand placement.  Non reciprocal pattern performed forward and backwards to negotiate.          Wheelchair Mobility    Modified Rankin (Stroke Patients Only)       Balance Overall balance assessment: Needs assistance Sitting-balance support: Feet supported;No upper extremity supported Sitting  balance-Leahy Scale: Good       Standing balance-Leahy Scale: Poor                      Cognition Arousal/Alertness: Awake/alert Behavior During Therapy: WFL for tasks assessed/performed Overall Cognitive Status: Within Functional Limits for tasks assessed                      Exercises Total Joint Exercises Ankle Circles/Pumps: AROM;Both;10 reps;Supine Quad Sets: AROM;Left;10 reps;Supine Short Arc Quad: AROM;Left;10 reps;Supine Heel Slides: AROM;Supine;Left;10 reps Hip ABduction/ADduction: AROM;Left;Supine;AAROM;20 reps;Standing (1x10 in supine and 1x10 in standing.) Knee Flexion: AROM;Left;10 reps;Standing Marching in Standing: AROM;Left;10 reps Standing Hip Extension: AROM;Left;10 reps;Standing    General Comments        Pertinent Vitals/Pain Pain Assessment: 0-10 Pain Score: 5  Pain Location: L hip with movement, L quad/groin Pain Descriptors / Indicators: Tightness (pulling) Pain Intervention(s): Limited activity within patient's tolerance;Repositioned;Ice applied    Home Living                      Prior Function            PT Goals (current goals can now be found in the care plan section) Acute Rehab PT Goals Patient Stated Goal: to go home, get back to normal Potential to Achieve Goals: Good Progress towards PT goals: Progressing toward goals    Frequency  7X/week    PT Plan Current plan remains appropriate    Co-evaluation  End of Session Equipment Utilized During Treatment: Gait belt Activity Tolerance: Patient tolerated treatment well Patient left: in chair;with call bell/phone within reach;with family/visitor present     Time: 1610-9604 PT Time Calculation (min) (ACUTE ONLY): 18 min  Charges:  $Gait Training: 8-22 mins                    G Codes:      Florestine Avers May 21, 2016, 11:07 AM  Joycelyn Rua, PTA pager [phone]

## 2016-04-25 NOTE — Evaluation (Signed)
Occupational Therapy Evaluation and Discharge Patient Details Name: DETRON CARRAS MRN: [id number] DOB: 07/06/1961 Today's Date: 04/25/2016    History of Present Illness 55 y.o. male admitted to [facility] on 04/24/16 for elective L THA.  Pt with significant PMHx of numbness in left leg to toes, claustrophobia, R RTC repair, and cervical spine surgery.     Clinical Impression   Pt demonstrated ability to perform ADL and ADL transfers at a modified independent level. All education completed. No further OT needs.    Follow Up Recommendations  No OT follow up    Equipment Recommendations  None recommended by OT    Recommendations for Other Services       Precautions / Restrictions Precautions Precautions: None Restrictions Weight Bearing Restrictions: Yes LLE Weight Bearing: Weight bearing as tolerated      Mobility Bed Mobility               General bed mobility comments: Pt sitting in chair on arrival.    Transfers Overall transfer level: Modified independent Equipment used: Rolling walker (2 wheeled) Transfers: Sit to/from Stand Sit to Stand: Modified independent (Device/Increase time)   Squat pivot transfers: Supervision     General transfer comment: good technique and safety    Balance Overall balance assessment: Needs assistance Sitting-balance support: Feet supported;No upper extremity supported Sitting balance-Leahy Scale: Good       Standing balance-Leahy Scale: Poor                              ADL Overall ADL's : Modified independent                                       General ADL Comments: Instructed in compensatory strategies and safe footwear.     Vision     Perception     Praxis      Pertinent Vitals/Pain Pain Assessment: Faces Pain Score: 5  Faces Pain Scale: Hurts little more Pain Location: L hip/buttocks Pain Descriptors / Indicators: Burning;Sore;Grimacing;Guarding Pain Intervention(s):  Monitored during session;Premedicated before session;Repositioned;Ice applied     Hand Dominance Right   Extremity/Trunk Assessment Upper Extremity Assessment Upper Extremity Assessment: Overall WFL for tasks assessed (hx of R shoulder sx)   Lower Extremity Assessment Lower Extremity Assessment: Defer to PT evaluation   Cervical / Trunk Assessment Cervical / Trunk Exceptions: h/o cervical spine surgery   Communication Communication Communication: No difficulties   Cognition Arousal/Alertness: Awake/alert Behavior During Therapy: WFL for tasks assessed/performed Overall Cognitive Status: Within Functional Limits for tasks assessed                     General Comments       Exercises       Shoulder Instructions      Home Living Family/patient expects to be discharged to:: Private residence Living Arrangements: Spouse/significant other Available Help at Discharge: Family;Available 24 hours/day Type of Home: House Home Access: Stairs to enter [employer] of Steps: 4 Entrance Stairs-Rails: Right;Left;Can reach both Home Layout: One level     Bathroom Shower/Tub: Door;Walk-in Human resources officer: Handicapped height     Home Equipment: Environmental consultant - 2 wheels;Cane - single point;Bedside commode;Shower seat - built in;Wheelchair - manual          Prior Functioning/Environment Level of Independence: Independent  Comments: works, farming    OT Diagnosis: Generalized weakness;Acute pain   OT Problem List:     OT Treatment/Interventions:      OT Goals(Current goals can be found in the care plan section) Acute Rehab OT Goals Patient Stated Goal: to go home, get back to normal  OT Frequency:     Barriers to D/C:            Co-evaluation              End of Session Equipment Utilized During Treatment: Rolling walker  Activity Tolerance: Patient tolerated treatment well Patient left: in chair;with call bell/phone within  reach   Time: 1350-1411 OT Time Calculation (min): 21 min Charges:  OT General Charges $OT Visit: 1 Procedure OT Evaluation $OT Eval Low Complexity: 1 Procedure G-Codes:    Evern BioMayberry, Merit Maybee Lynn 04/25/2016, 2:12 PM  [phone]

## 2016-04-25 NOTE — Anesthesia Postprocedure Evaluation (Signed)
Anesthesia Post Note  Patient: Corey Phelps  Procedure(s) Performed: Procedure(s) (LRB): LEFT TOTAL HIP ARTHROPLASTY ANTERIOR APPROACH (Left)  Patient location during evaluation: PACU Anesthesia Type: Spinal Level of consciousness: awake Pain management: pain level controlled Vital Signs Assessment: post-procedure vital signs reviewed and stable Cardiovascular status: stable Postop Assessment: no signs of nausea or vomiting and spinal receding Anesthetic complications: no    Last Vitals:  Vitals:   04/25/16 0100 04/25/16 0447  BP: 113/69 123/71  Pulse: 68 92  Resp: 16 16  Temp: 36.7 C 36.6 C    Last Pain:  Vitals:   04/25/16 1136  TempSrc:   PainSc: 5                  Gaylen Venning

## 2016-04-25 NOTE — Discharge Summary (Signed)
Patient ID: Corey Phelps MRN: [id number] DOB/AGE: 04/01/1961 55 y.o.  Admit date: 04/24/2016 Discharge date: 04/25/2016  Admission Diagnoses:  Active Problems:   Primary osteoarthritis of left hip   Discharge Diagnoses:  Same  Past Medical History:  Diagnosis Date  . Claustrophobia   . History of kidney stones   . Numbness    left leg to toes   . Wears glasses     Surgeries: Procedure(s): LEFT TOTAL HIP ARTHROPLASTY ANTERIOR APPROACH on 04/24/2016   Consultants:   Discharged Condition: Improved  Hospital Course: Corey Phelps is an 55 y.o. male who was admitted 04/24/2016 for operative treatment of<principal problem not specified>. Patient has severe unremitting pain that affects sleep, daily activities, and work/hobbies. After pre-op clearance the patient was taken to the operating room on 04/24/2016 and underwent  Procedure(s): LEFT TOTAL HIP ARTHROPLASTY ANTERIOR APPROACH.    Patient was given perioperative antibiotics: Anti-infectives    Start     Dose/Rate Route Frequency Ordered Stop   04/24/16 1800  ceFAZolin (ANCEF) IVPB 2g/100 mL premix     2 g 200 mL/hr over 30 Minutes Intravenous Every 6 hours 04/24/16 1531 04/25/16 0135   04/24/16 0829  ceFAZolin (ANCEF) 2-4 GM/100ML-% IVPB    Comments:  Rosenberger, Meredit: cabinet override      04/24/16 0829 04/24/16 2044   04/24/16 0826  ceFAZolin (ANCEF) IVPB 2g/100 mL premix     2 g 200 mL/hr over 30 Minutes Intravenous On call to O.R. 04/24/16 [phone] 04/24/16 1030       Patient was given sequential compression devices, early ambulation, and chemoprophylaxis to prevent DVT.  Patient benefited maximally from hospital stay and there were no complications.    Recent vital signs: Patient Vitals for the past 24 hrs:  BP Temp Temp src Pulse Resp SpO2 Height Weight  04/25/16 0447 123/71 97.8 F (36.6 C) Oral 92 16 97 % - -  04/25/16 0100 113/69 98.1 F (36.7 C) Oral 68 16 96 % - -  04/24/16 2149 123/74 98.8 F (37.1  C) Oral 63 16 98 % - -  04/24/16 1726 115/71 97.5 F (36.4 C) Axillary (!) 59 16 100 % - -  04/24/16 1700 - 98 F (36.7 C) - - - - - -  04/24/16 1630 - - - (!) 57 15 97 % - -  04/24/16 1629 116/72 - - (!) 59 (!) 8 96 % - -  04/24/16 1615 - - - (!) 53 (!) 9 97 % - -  04/24/16 1614 114/72 - - (!) 56 11 99 % - -  04/24/16 1600 - 98.3 F (36.8 C) - (!) 59 13 98 % - -  04/24/16 1559 118/73 - - (!) 55 19 98 % - -  04/24/16 1545 - - - (!) 58 (!) 9 98 % - -  04/24/16 1544 110/75 - - (!) 56 11 99 % - -  04/24/16 1530 - - - (!) 53 10 98 % - -  04/24/16 1528 118/88 - - (!) 59 17 97 % - -  04/24/16 1515 - - - 64 14 98 % - -  04/24/16 1514 102/77 - - 78 13 98 % - -  04/24/16 1500 - - - 61 12 100 % - -  04/24/16 1459 110/80 - - (!) 57 (!) 8 95 % - -  04/24/16 1447 (!) 130/93 - - (!) 58 16 99 % - -  04/24/16 1445 - - - (!) 56 17 100 % - -  04/24/16 1430 - - - (!) 54 12 100 % - -  04/24/16 1429 125/79 - - (!) 51 12 100 % - -  04/24/16 1415 - - - (!) 51 12 100 % - -  04/24/16 1413 120/83 - - (!) 48 12 100 % - -  04/24/16 1400 - - - (!) 52 14 100 % - -  04/24/16 1359 105/68 - - (!) 51 14 100 % - -  04/24/16 1345 - - - (!) 53 16 100 % - -  04/24/16 1344 109/75 - - (!) 53 18 100 % - -  04/24/16 1330 - - - (!) 55 16 100 % - -  04/24/16 1329 109/70 - - (!) 49 11 100 % - -  04/24/16 1315 - - - (!) 50 (!) 9 100 % - -  04/24/16 1313 109/77 - - (!) 53 14 100 % - -  04/24/16 1300 - - - (!) 58 17 100 % - -  04/24/16 1258 110/67 - - (!) 56 (!) 4 100 % - -  04/24/16 1245 - - - 62 (!) 21 100 % - -  04/24/16 1244 106/69 - - 64 12 99 % - -  04/24/16 1230 - 97.9 F (36.6 C) - 63 (!) 9 100 % - -  04/24/16 1229 108/66 - - 62 (!) 9 100 % - -  04/24/16 0929 (!) 133/97 97.8 F (36.6 C) Oral 61 18 98 % 5\' 10"  (1.778 m) 90.3 kg (199 lb)     Recent laboratory studies: No results for input(s): WBC, HGB, HCT, PLT, NA, K, CL, CO2, BUN, CREATININE, GLUCOSE, INR, CALCIUM in the last 72 hours.  Invalid input(s): PT,  2   Discharge Medications:     Medication List    TAKE these medications   aspirin 325 MG EC tablet Take 1 tablet (325 mg total) by mouth 2 (two) times daily after a meal.   methocarbamol 500 MG tablet Commonly known as:  ROBAXIN Take 1 tablet (500 mg total) by mouth every 6 (six) hours as needed for muscle spasms.   metroNIDAZOLE 0.75 % cream Commonly known as:  METROCREAM Apply 1 application topically daily as needed (for rosacea).       Diagnostic Studies: Dg Chest 2 View  Result Date: 04/12/2016 CLINICAL DATA:  Preoperative hip replacement EXAM: CHEST  2 VIEW COMPARISON:  None. FINDINGS: There is no edema or consolidation. The heart size and pulmonary vascularity are normal. No adenopathy. There is postoperative change in the lower cervical spine region. IMPRESSION: No edema or consolidation. Electronically Signed   By: Bretta Bang III M.D.   On: 04/12/2016 09:28  Dg C-arm 1-60 Min  Result Date: 04/24/2016 CLINICAL DATA:  Left hip replacement. EXAM: OPERATIVE LEFT HIP (WITH PELVIS IF PERFORMED) 2 VIEWS TECHNIQUE: Fluoroscopic spot image(s) were submitted for interpretation post-operatively. COMPARISON:  None. FINDINGS: Two C-arm views of the left hip and inferior pelvis demonstrate a left hip bipolar prosthesis in satisfactory position and alignment. No fracture or dislocation seen. IMPRESSION: Satisfactory postoperative appearance of a left hip prosthesis. Electronically Signed   By: Beckie Salts M.D.   On: 04/24/2016 12:55   Dg Hip Operative Unilat W Or W/o Pelvis Left  Result Date: 04/24/2016 CLINICAL DATA:  Left hip replacement. EXAM: OPERATIVE LEFT HIP (WITH PELVIS IF PERFORMED) 2 VIEWS TECHNIQUE: Fluoroscopic spot image(s) were submitted for interpretation post-operatively. COMPARISON:  None. FINDINGS: Two C-arm views of the left hip and inferior pelvis demonstrate a  left hip bipolar prosthesis in satisfactory position and alignment. No fracture or dislocation seen.  IMPRESSION: Satisfactory postoperative appearance of a left hip prosthesis. Electronically Signed   By: Beckie Salts M.D.   On: 04/24/2016 12:55    Disposition: Final discharge disposition not confirmed  Discharge Instructions    Call MD / Call 911    Complete by:  As directed   If you experience chest pain or shortness of breath, CALL 911 and be transported to the hospital emergency room.  If you develope a fever above 101 F, pus (white drainage) or increased drainage or redness at the wound, or calf pain, call your surgeon's office.   Constipation Prevention    Complete by:  As directed   Drink plenty of fluids.  Prune juice may be helpful.  You may use a stool softener, such as Colace (over the counter) 100 mg twice a day.  Use MiraLax (over the counter) for constipation as needed.   Diet - low sodium heart healthy    Complete by:  As directed   Discharge instructions    Complete by:  As directed   INSTRUCTIONS AFTER JOINT REPLACEMENT   Remove items at home which could result in a fall. This includes throw rugs or furniture in walking pathways ICE to the affected joint every three hours while awake for 30 minutes at a time, for at least the first 3-5 days, and then as needed for pain and swelling.  Continue to use ice for pain and swelling. You may notice swelling that will progress down to the foot and ankle.  This is normal after surgery.  Elevate your leg when you are not up walking on it.   Continue to use the breathing machine you got in the hospital (incentive spirometer) which will help keep your temperature down.  It is common for your temperature to cycle up and down following surgery, especially at night when you are not up moving around and exerting yourself.  The breathing machine keeps your lungs expanded and your temperature down.   DIET:  As you were doing prior to hospitalization, we recommend a well-balanced diet.  DRESSING / WOUND CARE / SHOWERING  You may shower 3 days  after surgery, but keep the wounds dry during showering.  You may use an occlusive plastic wrap (Press'n Seal for example), NO SOAKING/SUBMERGING IN THE BATHTUB.  If the bandage gets wet, change with a clean dry gauze.  If the incision gets wet, pat the wound dry with a clean towel.  ACTIVITY  Increase activity slowly as tolerated, but follow the weight bearing instructions below.   No driving for 6 weeks or until further direction given by your physician.  You cannot drive while taking narcotics.  No lifting or carrying greater than 10 lbs. until further directed by your surgeon. Avoid periods of inactivity such as sitting longer than an hour when not asleep. This helps prevent blood clots.  You may return to work once you are authorized by your doctor.     WEIGHT BEARING   Weight bearing as tolerated with assist device (walker, cane, etc) as directed, use it as long as suggested by your surgeon or therapist, typically at least 4-6 weeks.   EXERCISES  Results after joint replacement surgery are often greatly improved when you follow the exercise, range of motion and muscle strengthening exercises prescribed by your doctor. Safety measures are also important to protect the joint from further injury. Any time any of  these exercises cause you to have increased pain or swelling, decrease what you are doing until you are comfortable again and then slowly increase them. If you have problems or questions, call your caregiver or physical therapist for advice.   Rehabilitation is important following a joint replacement. After just a few days of immobilization, the muscles of the leg can become weakened and shrink (atrophy).  These exercises are designed to build up the tone and strength of the thigh and leg muscles and to improve motion. Often times heat used for twenty to thirty minutes before working out will loosen up your tissues and help with improving the range of motion but do not use heat for  the first two weeks following surgery (sometimes heat can increase post-operative swelling).   These exercises can be done on a training (exercise) mat, on the floor, on a table or on a bed. Use whatever works the best and is most comfortable for you.    Use music or television while you are exercising so that the exercises are a pleasant break in your day. This will make your life better with the exercises acting as a break in your routine that you can look forward to.   Perform all exercises about fifteen times, three times per day or as directed.  You should exercise both the operative leg and the other leg as well.   Exercises include:   Quad Sets - Tighten up the muscle on the front of the thigh (Quad) and hold for 5-10 seconds.   Straight Leg Raises - With your knee straight (if you were given a brace, keep it on), lift the leg to 60 degrees, hold for 3 seconds, and slowly lower the leg.  Perform this exercise against resistance later as your leg gets stronger.  Leg Slides: Lying on your back, slowly slide your foot toward your buttocks, bending your knee up off the floor (only go as far as is comfortable). Then slowly slide your foot back down until your leg is flat on the floor again.  Angel Wings: Lying on your back spread your legs to the side as far apart as you can without causing discomfort.  Hamstring Strength:  Lying on your back, push your heel against the floor with your leg straight by tightening up the muscles of your buttocks.  Repeat, but this time bend your knee to a comfortable angle, and push your heel against the floor.  You may put a pillow under the heel to make it more comfortable if necessary.   A rehabilitation program following joint replacement surgery can speed recovery and prevent re-injury in the future due to weakened muscles. Contact your doctor or a physical therapist for more information on knee rehabilitation.    CONSTIPATION  Constipation is defined medically  as fewer than three stools per week and severe constipation as less than one stool per week.  Even if you have a regular bowel pattern at home, your normal regimen is likely to be disrupted due to multiple reasons following surgery.  Combination of anesthesia, postoperative narcotics, change in appetite and fluid intake all can affect your bowels.   YOU MUST use at least one of the following options; they are listed in order of increasing strength to get the job done.  They are all available over the counter, and you may need to use some, POSSIBLY even all of these options:    Drink plenty of fluids (prune juice may be helpful) and high  fiber foods Colace 100 mg by mouth twice a day  Senokot for constipation as directed and as needed Dulcolax (bisacodyl), take with full glass of water  Miralax (polyethylene glycol) once or twice a day as needed.  If you have tried all these things and are unable to have a bowel movement in the first 3-4 days after surgery call either your surgeon or your primary doctor.    If you experience loose stools or diarrhea, hold the medications until you stool forms back up.  If your symptoms do not get better within 1 week or if they get worse, check with your doctor.  If you experience "the worst abdominal pain ever" or develop nausea or vomiting, please contact the office immediately for further recommendations for treatment.   ITCHING:  If you experience itching with your medications, try taking only a single pain pill, or even half a pain pill at a time.  You can also use Benadryl over the counter for itching or also to help with sleep.   TED HOSE STOCKINGS:  Use stockings on both legs until for at least 2 weeks or as directed by physician office. They may be removed at night for sleeping.  MEDICATIONS:  See your medication summary on the "After Visit Summary" that nursing will review with you.  You may have some home medications which will be placed on hold until you  complete the course of blood thinner medication.  It is important for you to complete the blood thinner medication as prescribed.  PRECAUTIONS:  If you experience chest pain or shortness of breath - call 911 immediately for transfer to the hospital emergency department.   If you develop a fever greater that 101 F, purulent drainage from wound, increased redness or drainage from wound, foul odor from the wound/dressing, or calf pain - CONTACT YOUR SURGEON.                                                   FOLLOW-UP APPOINTMENTS:  If you do not already have a post-op appointment, please call the office for an appointment to be seen by your surgeon.  Guidelines for how soon to be seen are listed in your "After Visit Summary", but are typically between 1-4 weeks after surgery.  OTHER INSTRUCTIONS:   Knee Replacement:  Do not place pillow under knee, focus on keeping the knee straight while resting. CPM instructions: 0-90 degrees, 2 hours in the morning, 2 hours in the afternoon, and 2 hours in the evening. Place foam block, curve side up under heel at all times except when in CPM or when walking.  DO NOT modify, tear, cut, or change the foam block in any way.  MAKE SURE YOU:  Understand these instructions.  Get help right away if you are not doing well or get worse.    Thank you for letting us be a part of your medical care team.  It is a privilege we respect greatly.  We hope these instructions will help you stay on track for a fast and full recovery!   Increase activity slowly as tolerated    Complete by:  As directed      Follow-up Information    DALLDORF,PETER G, MD. Schedule an appointment as soon as possible for a visit in 2 week(s).   Specialty:  Orthopedic Surgery  Contact information: [address] [place] [address] [phone]            Signed: Drema Halon 04/25/2016, 7:57 AM

## 2016-04-25 NOTE — Care Management Note (Signed)
Case Management Note  Patient Details  Name: Corey FuellingMichael S Manfredonia MRN: [id number] Date of Birth: December 08, 1960  Subjective/Objective:     Left Total Hip Arthroplasty               Action/Plan: Discharge Planning: AVS reviewed:  NCM spoke to pt and [facility] Pt states he has RW and declines bedside commode. Has elevated toilet seat at home.    Expected Discharge Date:  04/25/2016              Expected Discharge Plan:  Home w Home Health Services  In-House Referral:  NA  Discharge planning Services  CM Consult  Post Acute Care Choice:  Home Health Choice offered to:  Patient  DME Arranged:  N/A DME Agency:  NA  HH Arranged:  PT, OT, Nurse's Aide HH Agency:  [facility]  Status of Service:  Completed, signed off  If discussed at Long Length of Stay Meetings, dates discussed:    Additional Comments:  Elliot CousinShavis, Marlaya Turck Ellen, RN 04/25/2016, 9:56 AM

## 2016-04-25 NOTE — Progress Notes (Signed)
Pt stable for d/c home today per MD. Pt cleared PT/OT, has needed equipment at home. Pain is controlled by PO medication, and no other issues noted. Discharge instructions and prescriptions reviewed with pt and his wife, all questions answered. Belongings sent with pt's wife. Pt assisted to car via wheelchair.   [place]ale[place]udson, Latricia HeftKorie G

## 2016-04-25 NOTE — Progress Notes (Signed)
Subjective: 1 Day Post-Op Procedure(s) (LRB): LEFT TOTAL HIP ARTHROPLASTY ANTERIOR APPROACH (Left)  Activity level:  wbat Diet tolerance:  ok Voiding:  ok Patient reports pain as mild.    Objective: Vital signs in last 24 hours: Temp:  [97.5 F (36.4 C)-98.8 F (37.1 C)] 97.8 F (36.6 C) (08/09 0447) Pulse Rate:  [48-92] 92 (08/09 0447) Resp:  [4-21] 16 (08/09 0447) BP: (102-133)/(66-97) 123/71 (08/09 0447) SpO2:  [95 %-100 %] 97 % (08/09 0447) Weight:  [90.3 kg (199 lb)] 90.3 kg (199 lb) (08/08 0929)  Labs: No results for input(s): HGB in the last 72 hours. No results for input(s): WBC, RBC, HCT, PLT in the last 72 hours. No results for input(s): NA, K, CL, CO2, BUN, CREATININE, GLUCOSE, CALCIUM in the last 72 hours. No results for input(s): LABPT, INR in the last 72 hours.  Physical Exam:  Neurologically intact ABD soft Neurovascular intact Sensation intact distally Intact pulses distally Dorsiflexion/Plantar flexion intact Incision: dressing C/D/I and no drainage No cellulitis present Compartment soft  Assessment/Plan:  1 Day Post-Op Procedure(s) (LRB): LEFT TOTAL HIP ARTHROPLASTY ANTERIOR APPROACH (Left) Advance diet Up with therapy D/C IV fluids Discharge home with home health today after PT. Follow up in office 2 weeks post op. Conitnue on ASA 325mg  BID x 4 weeks post op.  Saleen Peden, Ginger OrganNDREW PAUL 04/25/2016, 7:52 AM

## 2016-11-07 ENCOUNTER — Other Ambulatory Visit: Payer: Self-pay | Admitting: Orthopaedic Surgery

## 2016-11-07 DIAGNOSIS — M25552 Pain in left hip: Secondary | ICD-10-CM

## 2016-11-19 ENCOUNTER — Ambulatory Visit
Admission: RE | Admit: 2016-11-19 | Discharge: 2016-11-19 | Disposition: A | Discharge status: Home / Self Care | Payer: BLUE CROSS/BLUE SHIELD | Source: Ambulatory Visit | Attending: Orthopaedic Surgery | Admitting: Orthopaedic Surgery

## 2016-11-19 DIAGNOSIS — M25552 Pain in left hip: Secondary | ICD-10-CM

## 2017-09-27 ENCOUNTER — Other Ambulatory Visit: Payer: Self-pay | Admitting: Surgery

## 2017-09-30 ENCOUNTER — Other Ambulatory Visit: Payer: Self-pay | Admitting: Surgery

## 2017-09-30 DIAGNOSIS — R1011 Right upper quadrant pain: Secondary | ICD-10-CM

## 2017-10-03 ENCOUNTER — Ambulatory Visit
Admission: RE | Admit: 2017-10-03 | Discharge: 2017-10-03 | Disposition: A | Discharge status: Home / Self Care | Payer: BLUE CROSS/BLUE SHIELD | Source: Ambulatory Visit | Attending: Surgery | Admitting: Surgery

## 2017-10-03 DIAGNOSIS — R1011 Right upper quadrant pain: Secondary | ICD-10-CM

## 2017-10-23 ENCOUNTER — Other Ambulatory Visit: Payer: Self-pay | Admitting: Surgery

## 2017-10-23 DIAGNOSIS — K811 Chronic cholecystitis: Secondary | ICD-10-CM | POA: Diagnosis not present

## 2017-10-25 ENCOUNTER — Encounter (HOSPITAL_COMMUNITY): Payer: Self-pay | Admitting: *Deleted

## 2017-10-25 ENCOUNTER — Other Ambulatory Visit: Payer: Self-pay

## 2017-10-25 NOTE — Progress Notes (Signed)
Pt denies SOB, chest pain, and being under the care of a cardiologist. Pt denies having a stress test, echo and cardiac cath. Pt denies having a chest x ray and EKG within the last year. Pt denies recent labs. Pt made aware to stop taking  Aspirin, vitamins, fish oil  and herbal medications. Do not take any NSAIDs ie: Ibuprofen, Advil, Naproxen (Aleve), Motrin, BC and Goody Powder. Pt verbalized understanding of all pre-op instructions.  

## 2017-10-27 NOTE — H&P (Signed)
Corey Phelps  Location: [facility] Patient #: [id number] DOB: 1961-05-06 Married / Language: English / Race: White Male   History of Present Illness  The patient is a 57 year old male who presents with abdominal pain. This gentleman is here for evaluation of right upper quadrant abdominal pain. He had the sudden onset of right upper quadrant abdominal pain in the morning this past weekend. He had nausea with it and pain through to the back. He also had bloating. He has improved somewhat but is still having discomfort. He was seen in urgent care. His white blood count at that time was 12. A urinalysis and chest x-ray were unremarkable. He had a previous ultrasound of his gallbladder in 2006 as well as a HIDA scan which were both normal. He still has occasional right upper quadrant abdominal pain almost every month. He had a negative upper endoscopy. Repeat ultrasound is also normal.  He still is having significant biliary colic  Past Medical History:  Diagnosis Date  . Claustrophobia   . History of kidney stones   . Numbness    left leg to toes   . Wears glasses          Past Surgical History:  Procedure Laterality Date  . CERVICAL SPINE SURGERY    . ESOPHAGEAL DILATION    . ROTATOR CUFF REPAIR Right        Allergies  Erythromycin *MACROLIDES*   Medication History Doxycycline Hyclate (100MG  Capsule, Oral) Active. Medications Reconciled    Review of Systems  General Not Present- Appetite Loss, Chills, Fatigue, Fever, Night Sweats, Weight Gain and Weight Loss. Skin Present- Rash. Not Present- Change in Wart/Mole, Dryness, Hives, Jaundice, New Lesions, Non-Healing Wounds and Ulcer. HEENT Present- Hearing Loss, Ringing in the Ears, Seasonal Allergies, Sore Throat and Wears glasses/contact lenses. Not Present- Earache, Hoarseness, Nose Bleed, Oral Ulcers, Sinus Pain, Visual Disturbances and Yellow Eyes. Respiratory Present- Snoring. Not  Present- Bloody sputum, Chronic Cough, Difficulty Breathing and Wheezing. Breast Not Present- Breast Mass, Breast Pain, Nipple Discharge and Skin Changes. Cardiovascular Present- Leg Cramps. Not Present- Chest Pain, Difficulty Breathing Lying Down, Palpitations, Rapid Heart Rate, Shortness of Breath and Swelling of Extremities. Gastrointestinal Present- Bloating, Difficulty Swallowing, Hemorrhoids, Indigestion and Rectal Pain. Not Present- Abdominal Pain, Bloody Stool, Change in Bowel Habits, Chronic diarrhea, Constipation, Excessive gas, Gets full quickly at meals, Nausea and Vomiting. Male Genitourinary Present- Frequency and Urgency. Not Present- Blood in Urine, Change in Urinary Stream, Impotence, Nocturia, Painful Urination and Urine Leakage. Musculoskeletal Present- Back Pain, Joint Pain and Joint Stiffness. Not Present- Muscle Pain, Muscle Weakness and Swelling of Extremities. Neurological Present- Headaches. Not Present- Decreased Memory, Fainting, Numbness, Seizures, Tingling, Tremor, Trouble walking and Weakness. Psychiatric Present- Anxiety. Not Present- Bipolar, Change in Sleep Pattern, Depression, Fearful and Frequent crying. Endocrine Not Present- Cold Intolerance, Excessive Hunger, Hair Changes, Heat Intolerance, Hot flashes and New Diabetes. Hematology Not Present- Blood Thinners, Easy Bruising, Excessive bleeding, Gland problems, HIV and Persistent Infections.  Vitals (Chemira Jones CMA; 09/27/2017 11:30 AM) 09/27/2017 11:30 AM Weight: 202.2 lb Height: 70in Body Surface Area: 2.1 m Body Mass Index: 29.01 kg/m  Temp.: 50F(Oral)  Pulse: 83 (Regular)  BP: 112/76 (Sitting, Left Arm, Standard)       Physical Exam  General Mental Status-Alert. General Appearance-Consistent with stated age. Hydration-Well hydrated. Voice-Normal.  Head and Neck Head-normocephalic, atraumatic with no lesions or palpable masses.  Eye Eyeball - Bilateral-Extraocular  movements intact. Sclera/Conjunctiva - Bilateral-No scleral icterus.  Chest and Lung Exam Chest and lung exam reveals -quiet, even and easy respiratory effort with no use of accessory muscles and on auscultation, normal breath sounds, no adventitious sounds and normal vocal resonance. Inspection Chest Wall - Normal. Back - normal.  Cardiovascular Cardiovascular examination reveals -on palpation PMI is normal in location and amplitude, no palpable S3 or S4. Normal cardiac borders., normal heart sounds, regular rate and rhythm with no murmurs, carotid auscultation reveals no bruits and normal pedal pulses bilaterally.  Abdomen Inspection Inspection of the abdomen reveals - No Hernias. Skin - Scar - no surgical scars. Palpation/Percussion Palpation and Percussion of the abdomen reveal - Soft, No Rebound tenderness, No Rigidity (guarding) and No hepatosplenomegaly. Tenderness - Right Upper Quadrant. Note: He has very mild right upper quadrant tenderness. Auscultation Auscultation of the abdomen reveals - Bowel sounds normal.  Neurologic Neurologic evaluation reveals -alert and oriented x 3 with no impairment of recent or remote memory. Mental Status-Normal.  Musculoskeletal Normal Exam - Left-Upper Extremity Strength Normal and Lower Extremity Strength Normal. Normal Exam - Right-Upper Extremity Strength Normal, Lower Extremity Weakness.    Assessment & Plan   CHRONIC CHOLECYSTITIS WITHOUT CALCULUS (K81.1)  Impression: Based on his symptoms, I do believe he has chronic cholecystitis. He is very eager to proceed with a cholecystectomy. We discussed this in detail. I discussed the risks of surgery which includes but is not limited to bleeding, infection, injury to surrounding structures, the need to convert to an open procedure, cardiopulmonary issues, the chance this may not resolve his symptoms, postoperative recovery, etc. He understands and wished to proceed with surgery  which will be scheduled urgently because of his symptoms.

## 2017-10-28 ENCOUNTER — Ambulatory Visit (HOSPITAL_COMMUNITY)
Admission: RE | Admit: 2017-10-28 | Discharge: 2017-10-28 | Disposition: A | Discharge status: Home / Self Care | Payer: BLUE CROSS/BLUE SHIELD | Source: Ambulatory Visit | Attending: Surgery | Admitting: Surgery

## 2017-10-28 ENCOUNTER — Encounter (HOSPITAL_COMMUNITY): Payer: Self-pay | Admitting: Certified Registered Nurse Anesthetist

## 2017-10-28 ENCOUNTER — Ambulatory Visit (HOSPITAL_COMMUNITY): Payer: BLUE CROSS/BLUE SHIELD | Admitting: Certified Registered Nurse Anesthetist

## 2017-10-28 ENCOUNTER — Encounter (HOSPITAL_COMMUNITY)
Admission: RE | Disposition: A | Discharge status: Home / Self Care | Payer: Self-pay | Source: Ambulatory Visit | Attending: Surgery

## 2017-10-28 ENCOUNTER — Other Ambulatory Visit: Payer: Self-pay

## 2017-10-28 DIAGNOSIS — F40240 Claustrophobia: Secondary | ICD-10-CM | POA: Insufficient documentation

## 2017-10-28 DIAGNOSIS — M1990 Unspecified osteoarthritis, unspecified site: Secondary | ICD-10-CM | POA: Diagnosis not present

## 2017-10-28 DIAGNOSIS — Z87442 Personal history of urinary calculi: Secondary | ICD-10-CM | POA: Diagnosis not present

## 2017-10-28 DIAGNOSIS — Z7982 Long term (current) use of aspirin: Secondary | ICD-10-CM | POA: Insufficient documentation

## 2017-10-28 DIAGNOSIS — F419 Anxiety disorder, unspecified: Secondary | ICD-10-CM | POA: Insufficient documentation

## 2017-10-28 DIAGNOSIS — Z79899 Other long term (current) drug therapy: Secondary | ICD-10-CM | POA: Insufficient documentation

## 2017-10-28 DIAGNOSIS — Z881 Allergy status to other antibiotic agents status: Secondary | ICD-10-CM | POA: Insufficient documentation

## 2017-10-28 DIAGNOSIS — M1612 Unilateral primary osteoarthritis, left hip: Secondary | ICD-10-CM | POA: Diagnosis not present

## 2017-10-28 DIAGNOSIS — K811 Chronic cholecystitis: Secondary | ICD-10-CM | POA: Insufficient documentation

## 2017-10-28 HISTORY — PX: CHOLECYSTECTOMY: SHX55

## 2017-10-28 HISTORY — DX: Chronic cholecystitis: K81.1

## 2017-10-28 LAB — CBC
HCT: 42.3 % (ref 39.0–52.0)
Hemoglobin: 13.9 g/dL (ref 13.0–17.0)
MCH: 30.8 pg (ref 26.0–34.0)
MCHC: 32.9 g/dL (ref 30.0–36.0)
MCV: 93.6 fL (ref 78.0–100.0)
Platelets: 206 K/uL (ref 150–400)
RBC: 4.52 MIL/uL (ref 4.22–5.81)
RDW: 12.3 % (ref 11.5–15.5)
WBC: 6.4 K/uL (ref 4.0–10.5)

## 2017-10-28 SURGERY — LAPAROSCOPIC CHOLECYSTECTOMY
Anesthesia: General

## 2017-10-28 MED ORDER — ONDANSETRON HCL 4 MG/2ML IJ SOLN
INTRAMUSCULAR | Status: DC | PRN
  Administered 2017-10-28: 4 mg via INTRAVENOUS

## 2017-10-28 MED ORDER — LIDOCAINE 2% (20 MG/ML) 5 ML SYRINGE
INTRAMUSCULAR | Status: DC | PRN
  Administered 2017-10-28: 100 mg via INTRAVENOUS

## 2017-10-28 MED ORDER — SODIUM CHLORIDE 0.9 % IR SOLN
Status: DC | PRN
  Administered 2017-10-28: 1000 mL

## 2017-10-28 MED ORDER — DEXAMETHASONE SODIUM PHOSPHATE 10 MG/ML IJ SOLN
INTRAMUSCULAR | Status: AC
  Filled 2017-10-28: qty 1

## 2017-10-28 MED ORDER — MORPHINE SULFATE (PF) 2 MG/ML IV SOLN: 2.0000 mg | INTRAVENOUS | Status: DC | PRN

## 2017-10-28 MED ORDER — KETOROLAC TROMETHAMINE 30 MG/ML IJ SOLN
INTRAMUSCULAR | Status: DC | PRN
  Administered 2017-10-28: 30 mg via INTRAVENOUS

## 2017-10-28 MED ORDER — ROCURONIUM BROMIDE 10 MG/ML (PF) SYRINGE
PREFILLED_SYRINGE | INTRAVENOUS | Status: DC | PRN
  Administered 2017-10-28: 50 mg via INTRAVENOUS

## 2017-10-28 MED ORDER — SODIUM CHLORIDE 0.9% FLUSH: 3.0000 mL | Freq: Two times a day (BID) | INTRAVENOUS | Status: DC

## 2017-10-28 MED ORDER — MIDAZOLAM HCL 2 MG/2ML IJ SOLN
INTRAMUSCULAR | Status: AC
  Filled 2017-10-28: qty 2

## 2017-10-28 MED ORDER — KETOROLAC TROMETHAMINE 30 MG/ML IJ SOLN
INTRAMUSCULAR | Status: AC
  Filled 2017-10-28: qty 1

## 2017-10-28 MED ORDER — DEXAMETHASONE SODIUM PHOSPHATE 10 MG/ML IJ SOLN
INTRAMUSCULAR | Status: DC | PRN
  Administered 2017-10-28: 10 mg via INTRAVENOUS

## 2017-10-28 MED ORDER — FENTANYL CITRATE (PF) 100 MCG/2ML IJ SOLN
INTRAMUSCULAR | Status: DC | PRN
  Administered 2017-10-28: 100 ug via INTRAVENOUS
  Administered 2017-10-28: 25 ug via INTRAVENOUS
  Administered 2017-10-28: 50 ug via INTRAVENOUS
  Administered 2017-10-28: 25 ug via INTRAVENOUS

## 2017-10-28 MED ORDER — MEPERIDINE HCL 50 MG/ML IJ SOLN: 6.2500 mg | INTRAMUSCULAR | Status: DC | PRN

## 2017-10-28 MED ORDER — LIDOCAINE 2% (20 MG/ML) 5 ML SYRINGE
INTRAMUSCULAR | Status: AC
  Filled 2017-10-28: qty 5

## 2017-10-28 MED ORDER — OXYCODONE HCL 5 MG PO TABS: 5.0000 mg | ORAL_TABLET | ORAL | Status: DC | PRN

## 2017-10-28 MED ORDER — SUGAMMADEX SODIUM 200 MG/2ML IV SOLN
INTRAVENOUS | Status: DC | PRN
  Administered 2017-10-28: 200 mg via INTRAVENOUS

## 2017-10-28 MED ORDER — ACETAMINOPHEN 325 MG PO TABS
650.0000 mg | ORAL_TABLET | ORAL | Status: DC | PRN
  Filled 2017-10-28: qty 2

## 2017-10-28 MED ORDER — LACTATED RINGERS IV SOLN
INTRAVENOUS | Status: DC | PRN
  Administered 2017-10-28: 07:00:00 via INTRAVENOUS

## 2017-10-28 MED ORDER — OXYCODONE HCL 5 MG PO TABS: 5.0000 mg | ORAL_TABLET | Freq: Once | ORAL | Status: DC | PRN

## 2017-10-28 MED ORDER — ACETAMINOPHEN 650 MG RE SUPP
650.0000 mg | RECTAL | Status: DC | PRN
  Filled 2017-10-28: qty 1

## 2017-10-28 MED ORDER — CEFAZOLIN SODIUM-DEXTROSE 2-4 GM/100ML-% IV SOLN
2.0000 g | INTRAVENOUS | Status: AC
  Administered 2017-10-28: 2 g via INTRAVENOUS
  Filled 2017-10-28: qty 100

## 2017-10-28 MED ORDER — MIDAZOLAM HCL 5 MG/5ML IJ SOLN
INTRAMUSCULAR | Status: DC | PRN
  Administered 2017-10-28: 2 mg via INTRAVENOUS

## 2017-10-28 MED ORDER — BUPIVACAINE-EPINEPHRINE 0.25% -1:200000 IJ SOLN
INTRAMUSCULAR | Status: AC
  Filled 2017-10-28: qty 1

## 2017-10-28 MED ORDER — PROMETHAZINE HCL 25 MG/ML IJ SOLN: 6.2500 mg | INTRAMUSCULAR | Status: DC | PRN

## 2017-10-28 MED ORDER — CHLORHEXIDINE GLUCONATE CLOTH 2 % EX PADS: 6.0000 | MEDICATED_PAD | Freq: Once | CUTANEOUS | Status: DC

## 2017-10-28 MED ORDER — SODIUM CHLORIDE 0.9 % IV SOLN: 250.0000 mL | INTRAVENOUS | Status: DC | PRN

## 2017-10-28 MED ORDER — SODIUM CHLORIDE 0.9% FLUSH: 3.0000 mL | INTRAVENOUS | Status: DC | PRN

## 2017-10-28 MED ORDER — ROCURONIUM BROMIDE 10 MG/ML (PF) SYRINGE
PREFILLED_SYRINGE | INTRAVENOUS | Status: AC
  Filled 2017-10-28: qty 5

## 2017-10-28 MED ORDER — 0.9 % SODIUM CHLORIDE (POUR BTL) OPTIME
TOPICAL | Status: DC | PRN
  Administered 2017-10-28: 1000 mL

## 2017-10-28 MED ORDER — BUPIVACAINE-EPINEPHRINE 0.25% -1:200000 IJ SOLN
INTRAMUSCULAR | Status: DC | PRN
  Administered 2017-10-28: 50 mL

## 2017-10-28 MED ORDER — OXYCODONE HCL 5 MG PO TABS: 5.0000 mg | ORAL_TABLET | Freq: Four times a day (QID) | ORAL | 0 refills | Status: AC | PRN

## 2017-10-28 MED ORDER — OXYCODONE HCL 5 MG/5ML PO SOLN: 5.0000 mg | Freq: Once | ORAL | Status: DC | PRN

## 2017-10-28 MED ORDER — ONDANSETRON HCL 4 MG/2ML IJ SOLN
INTRAMUSCULAR | Status: AC
  Filled 2017-10-28: qty 2

## 2017-10-28 MED ORDER — HYDROMORPHONE HCL 1 MG/ML IJ SOLN
0.2500 mg | INTRAMUSCULAR | Status: DC | PRN
  Administered 2017-10-28: 0.5 mg via INTRAVENOUS

## 2017-10-28 MED ORDER — PROPOFOL 10 MG/ML IV BOLUS
INTRAVENOUS | Status: AC
  Filled 2017-10-28: qty 40

## 2017-10-28 MED ORDER — SUGAMMADEX SODIUM 200 MG/2ML IV SOLN
INTRAVENOUS | Status: AC
  Filled 2017-10-28: qty 2

## 2017-10-28 MED ORDER — FENTANYL CITRATE (PF) 250 MCG/5ML IJ SOLN
INTRAMUSCULAR | Status: AC
  Filled 2017-10-28: qty 5

## 2017-10-28 MED ORDER — PROPOFOL 10 MG/ML IV BOLUS
INTRAVENOUS | Status: DC | PRN
  Administered 2017-10-28: 160 mg via INTRAVENOUS

## 2017-10-28 MED ORDER — HYDROMORPHONE HCL 1 MG/ML IJ SOLN
INTRAMUSCULAR | Status: AC
  Administered 2017-10-28: 0.5 mg via INTRAVENOUS
  Filled 2017-10-28: qty 1

## 2017-10-28 SURGICAL SUPPLY — 37 items
APPLIER CLIP 5 13 M/L LIGAMAX5 (MISCELLANEOUS) ×3
CANISTER SUCT 3000ML PPV (MISCELLANEOUS) ×3 IMPLANT
CHLORAPREP W/TINT 26ML (MISCELLANEOUS) ×3 IMPLANT
CLIP APPLIE 5 13 M/L LIGAMAX5 (MISCELLANEOUS) ×1 IMPLANT
COVER SURGICAL LIGHT HANDLE (MISCELLANEOUS) ×3 IMPLANT
DERMABOND ADVANCED (GAUZE/BANDAGES/DRESSINGS) ×2
DERMABOND ADVANCED .7 DNX12 (GAUZE/BANDAGES/DRESSINGS) ×1 IMPLANT
ELECT REM PT RETURN 9FT ADLT (ELECTROSURGICAL) ×3
ELECTRODE REM PT RTRN 9FT ADLT (ELECTROSURGICAL) ×1 IMPLANT
GLOVE BIO SURGEON STRL SZ7.5 (GLOVE) ×3 IMPLANT
GLOVE BIOGEL PI IND STRL 6.5 (GLOVE) ×1 IMPLANT
GLOVE BIOGEL PI IND STRL 7.5 (GLOVE) ×1 IMPLANT
GLOVE BIOGEL PI INDICATOR 6.5 (GLOVE) ×2
GLOVE BIOGEL PI INDICATOR 7.5 (GLOVE) ×2
GLOVE ECLIPSE 6.0 STRL STRAW (GLOVE) ×3 IMPLANT
GLOVE SURG SIGNA 7.5 PF LTX (GLOVE) ×3 IMPLANT
GOWN STRL REUS W/ TWL LRG LVL3 (GOWN DISPOSABLE) ×2 IMPLANT
GOWN STRL REUS W/ TWL XL LVL3 (GOWN DISPOSABLE) ×1 IMPLANT
GOWN STRL REUS W/TWL LRG LVL3 (GOWN DISPOSABLE) ×4
GOWN STRL REUS W/TWL XL LVL3 (GOWN DISPOSABLE) ×2
KIT BASIN OR (CUSTOM PROCEDURE TRAY) ×3 IMPLANT
KIT ROOM TURNOVER OR (KITS) ×3 IMPLANT
NS IRRIG 1000ML POUR BTL (IV SOLUTION) ×3 IMPLANT
PAD ARMBOARD 7.5X6 YLW CONV (MISCELLANEOUS) ×3 IMPLANT
POUCH SPECIMEN RETRIEVAL 10MM (ENDOMECHANICALS) ×3 IMPLANT
SCISSORS LAP 5X35 DISP (ENDOMECHANICALS) ×3 IMPLANT
SET IRRIG TUBING LAPAROSCOPIC (IRRIGATION / IRRIGATOR) ×3 IMPLANT
SLEEVE ENDOPATH XCEL 5M (ENDOMECHANICALS) ×6 IMPLANT
SPECIMEN JAR SMALL (MISCELLANEOUS) ×3 IMPLANT
SUT MNCRL AB 4-0 PS2 18 (SUTURE) ×3 IMPLANT
TOWEL OR 17X24 6PK STRL BLUE (TOWEL DISPOSABLE) ×3 IMPLANT
TOWEL OR 17X26 10 PK STRL BLUE (TOWEL DISPOSABLE) ×3 IMPLANT
TRAY LAPAROSCOPIC MC (CUSTOM PROCEDURE TRAY) ×3 IMPLANT
TROCAR XCEL BLUNT TIP 100MML (ENDOMECHANICALS) ×3 IMPLANT
TROCAR XCEL NON-BLD 5MMX100MML (ENDOMECHANICALS) ×3 IMPLANT
TUBING INSUFFLATION (TUBING) ×3 IMPLANT
WATER STERILE IRR 1000ML POUR (IV SOLUTION) ×3 IMPLANT

## 2017-10-28 NOTE — OR Nursing (Signed)
I wasted .5mg  of Dilaudid in sharps box with Casilda CarlsJena Peterson RN as my witness

## 2017-10-28 NOTE — Transfer of Care (Signed)
Immediate Anesthesia Transfer of Care Note  Patient: Corey Phelps  Procedure(s) Performed: LAPAROSCOPIC CHOLECYSTECTOMY (N/A )  Patient Location: PACU  Anesthesia Type:General  Level of Consciousness: awake, alert , oriented and patient cooperative  Airway & Oxygen Therapy: Patient Spontanous Breathing and Patient connected to nasal cannula oxygen  Post-op Assessment: Report given to RN, Post -op Vital signs reviewed and stable and Patient moving all extremities X 4  Post vital signs: Reviewed and stable  Last Vitals:  Vitals:   10/28/17 0631  BP: 137/63  Pulse: 70  Resp: 20  Temp: 36.8 C  SpO2: 97%    Last Pain:  Vitals:   10/28/17 0631  TempSrc: Oral      Patients Stated Pain Goal: 8 (10/28/17 [id number])  Complications: No apparent anesthesia complications

## 2017-10-28 NOTE — Interval H&P Note (Signed)
History and Physical Interval Note: no change in H and P  10/28/2017 6:41 AM  Corey Phelps  has presented today for surgery, with the diagnosis of chronic cholecystitis  The various methods of treatment have been discussed with the patient and family. After consideration of risks, benefits and other options for treatment, the patient has consented to  Procedure(s): LAPAROSCOPIC CHOLECYSTECTOMY (N/A) as a surgical intervention .  The patient's history has been reviewed, patient examined, no change in status, stable for surgery.  I have reviewed the patient's chart and labs.  Questions were answered to the patient's satisfaction.     Julianah Marciel A

## 2017-10-28 NOTE — Discharge Instructions (Signed)
[facility] ______[facility] LAPAROSCOPIC SURGERY: POST OP INSTRUCTIONS Always review your discharge instruction sheet given to you by the facility where your surgery was performed. IF YOU HAVE DISABILITY OR FAMILY LEAVE FORMS, YOU MUST BRING THEM TO THE OFFICE FOR PROCESSING.   DO NOT GIVE THEM TO YOUR DOCTOR.  1. A prescription for pain medication may be given to you upon discharge.  Take your pain medication as prescribed, if needed.  If narcotic pain medicine is not needed, then you may take acetaminophen (Tylenol) or ibuprofen (Advil) as needed. 2. Take your usually prescribed medications unless otherwise directed. 3. If you need a refill on your pain medication, please contact your pharmacy.  They will contact our office to request authorization. Prescriptions will not be filled after 5pm or on week-ends. 4. You should follow a light diet the first few days after arrival home, such as soup and crackers, etc.  Be sure to include lots of fluids daily. 5. Most patients will experience some swelling and bruising in the area of the incisions.  Ice packs will help.  Swelling and bruising can take several days to resolve.  6. It is common to experience some constipation if taking pain medication after surgery.  Increasing fluid intake and taking a stool softener (such as Colace) will usually help or prevent this problem from occurring.  A mild laxative (Milk of Magnesia or Miralax) should be taken according to package instructions if there are no bowel movements after 48 hours. 7. Unless discharge instructions indicate otherwise, you may remove your bandages 24-48 hours after surgery, and you may shower at that time.  You may have steri-strips (small skin tapes) in place directly over the incision.  These strips should be left on the skin for 7-10 days.  If your surgeon used skin glue on the incision, you may shower in 24 hours.  The glue will flake off over the next 2-3 weeks.  Any sutures or  staples will be removed at the office during your follow-up visit. 8. ACTIVITIES:  You may resume regular (light) daily activities beginning the next day--such as daily self-care, walking, climbing stairs--gradually increasing activities as tolerated.  You may have sexual intercourse when it is comfortable.  Refrain from any heavy lifting or straining until approved by your doctor. a. You may drive when you are no longer taking prescription pain medication, you can comfortably wear a seatbelt, and you can safely maneuver your car and apply brakes. b. RETURN TO WORK:  __________________________________________________________ 9. You should see your doctor in the office for a follow-up appointment approximately 2-3 weeks after your surgery.  Make sure that you call for this appointment within a day or two after you arrive home to insure a convenient appointment time. 10. OTHER INSTRUCTIONS: OK TO SHOWER STARTING TOMORROW 11. NO LIFTING MORE THAN 15 POUNDS FOR 2 WEEKS 12. ICE PACK, TYLENOL, AND IBUPROFEN ALSO FOR PAIN 13. STOOL SOFTENER FOR CONSTIPATION __________________________________________________________________________________________________________________________ __________________________________________________________________________________________________________________________ WHEN TO CALL YOUR DOCTOR: 1. Fever over 101.0 2. Inability to urinate 3. Continued bleeding from incision. 4. Increased pain, redness, or drainage from the incision. 5. Increasing abdominal pain  The clinic staff is available to answer your questions during regular business hours.  Please dont hesitate to call and ask to speak to one of the nurses for clinical concerns.  If you have a medical emergency, go to the nearest emergency room or call 911.  [facility] is always on call at the hospital.  [address], [address], [place], [place]  [id number] ? [address] [address], [place],  [place]   [id number] [phone] ? [phone] ? FAX [phone] Web site: [email or web address]

## 2017-10-28 NOTE — Op Note (Signed)
Laparoscopic Cholecystectomy Procedure Note  Indications: This patient presents with symptomatic gallbladder disease and will undergo laparoscopic cholecystectomy.  Pre-operative Diagnosis: Chronic cholecystitis  Post-operative Diagnosis: Same  Surgeon: Abigail MiyamotoBLACKMAN,Sylvan Lahm A   Assistants: 0  Anesthesia: General endotracheal anesthesia  ASA Class: 2  Procedure Details  The patient was seen again in the Holding Room. The risks, benefits, complications, treatment options, and expected outcomes were discussed with the patient. The possibilities of reaction to medication, pulmonary aspiration, perforation of viscus, bleeding, recurrent infection, finding a normal gallbladder, the need for additional procedures, failure to diagnose a condition, the possible need to convert to an open procedure, and creating a complication requiring transfusion or operation were discussed with the patient. The likelihood of improving the patient's symptoms with return to their baseline status is good.  The patient and/or family concurred with the proposed plan, giving informed consent. The site of surgery properly noted. The patient was taken to Operating Room, identified as Corey Phelps and the procedure verified as Laparoscopic Cholecystectomy with Intraoperative Cholangiogram. A Time Out was held and the above information confirmed.  Prior to the induction of general anesthesia, antibiotic prophylaxis was administered. General endotracheal anesthesia was then administered and tolerated well. After the induction, the abdomen was prepped with Chloraprep and draped in sterile fashion. The patient was positioned in the supine position.  Local anesthetic agent was injected into the skin near the umbilicus and an incision made. We dissected down to the abdominal fascia with blunt dissection.  The fascia was incised vertically and we entered the peritoneal cavity bluntly under direct vision.  A pursestring suture of  0-Vicryl was placed around the fascial opening.  The Hasson cannula was inserted and secured with the stay suture.  Pneumoperitoneum was then created with CO2 and tolerated well without any adverse changes in the patient's vital signs. A 5-mm port was placed in the subxiphoid position.  Two 5-mm ports were placed in the right upper quadrant. All skin incisions were infiltrated with a local anesthetic agent before making the incision and placing the trocars which were done under direct vision.   We positioned the patient in reverse Trendelenburg, tilted slightly to the patient's left.  The gallbladder was identified, the fundus grasped and retracted cephalad. Adhesions were lysed bluntly and with the electrocautery where indicated, taking care not to injure any adjacent organs or viscus. The infundibulum was grasped and retracted laterally, exposing the peritoneum overlying the triangle of Calot. This was then divided and exposed in a blunt fashion. The cystic duct was clearly identified and bluntly dissected circumferentially. A critical view of the cystic duct and cystic artery was obtained.  The cystic duct was then ligated with clips and divided. The cystic artery was, dissected free, ligated with clips and divided as well.   The gallbladder was dissected from the liver bed in retrograde fashion with the electrocautery. The gallbladder was removed and placed in an Endocatch sac. The liver bed was irrigated and inspected. Hemostasis was achieved with the electrocautery. Copious irrigation was utilized and was repeatedly aspirated until clear.  The gallbladder and Endocatch sac were then removed through the umbilical port site.  The pursestring suture was used to close the umbilical fascia.    We again inspected the right upper quadrant for hemostasis.  Pneumoperitoneum was released as we removed the trocars.  4-0 Monocryl was used to close the skin.   Skin glue was then applied. The patient was then  extubated and brought to the recovery room  in stable condition. Instrument, sponge, and needle counts were correct at closure and at the conclusion of the case.   Findings: Chronic Cholecystitis without Cholelithiasis  Estimated Blood Loss: Minimal         Drains: 0         Specimens: Gallbladder           Complications: None; patient tolerated the procedure well.         Disposition: PACU - hemodynamically stable.         Condition: stable

## 2017-10-28 NOTE — Anesthesia Postprocedure Evaluation (Signed)
Anesthesia Post Note  Patient: Corey Phelps  Procedure(s) Performed: LAPAROSCOPIC CHOLECYSTECTOMY (N/A )     Patient location during evaluation: PACU Anesthesia Type: General Level of consciousness: awake and alert Pain management: pain level controlled Vital Signs Assessment: post-procedure vital signs reviewed and stable Respiratory status: spontaneous breathing, nonlabored ventilation and respiratory function stable Cardiovascular status: blood pressure returned to baseline and stable Postop Assessment: no apparent nausea or vomiting Anesthetic complications: no    Last Vitals:  Vitals:   10/28/17 0859 10/28/17 0910  BP:  (!) 154/90  Pulse: 66 70  Resp: (!) 23 20  Temp: (!) 36.4 C   SpO2: 99% 97%    Last Pain:  Vitals:   10/28/17 0910  TempSrc:   PainSc: 0-No pain                 Lowella CurbWarren Ray Miller

## 2017-10-28 NOTE — Anesthesia Procedure Notes (Signed)
Procedure Name: Intubation Date/Time: 10/28/2017 7:34 AM Performed by: Waynard EdwardsSmith, Veasna Santibanez A, CRNA Pre-anesthesia Checklist: Patient identified, Emergency Drugs available, Suction available and Patient being monitored Patient Re-evaluated:Patient Re-evaluated prior to induction Oxygen Delivery Method: Circle system utilized Preoxygenation: Pre-oxygenation with 100% oxygen Induction Type: IV induction Ventilation: Mask ventilation without difficulty and Oral airway inserted - appropriate to patient size Laryngoscope Size: Hyacinth MeekerMiller and 2 Grade View: Grade I Tube type: Oral Tube size: 8.0 mm Number of attempts: 1 Airway Equipment and Method: Stylet Placement Confirmation: ETT inserted through vocal cords under direct vision,  positive ETCO2 and breath sounds checked- equal and bilateral Secured at: 23 cm Tube secured with: Tape Dental Injury: Teeth and Oropharynx as per pre-operative assessment

## 2017-10-28 NOTE — Anesthesia Preprocedure Evaluation (Signed)
Anesthesia Evaluation  Patient identified by MRN, date of birth, ID band Patient awake    Reviewed: Allergy & Precautions, NPO status , Patient's Chart, lab work & pertinent test results  History of Anesthesia Complications Negative for: history of anesthetic complications  Airway Mallampati: II  TM Distance: >3 FB Neck ROM: Full    Dental no notable dental hx. (+) Teeth Intact   Pulmonary neg pulmonary ROS,    Pulmonary exam normal breath sounds clear to auscultation       Cardiovascular negative cardio ROS Normal cardiovascular exam Rhythm:Regular Rate:Normal     Neuro/Psych PSYCHIATRIC DISORDERS Anxiety negative neurological ROS     GI/Hepatic negative GI ROS, Neg liver ROS,   Endo/Other  negative endocrine ROS  Renal/GU negative Renal ROS     Musculoskeletal  (+) Arthritis , Osteoarthritis,    Abdominal   Peds  Hematology negative hematology ROS (+)   Anesthesia Other Findings   Reproductive/Obstetrics                             Anesthesia Physical  Anesthesia Plan  ASA: II  Anesthesia Plan: General   Post-op Pain Management:    Induction: Intravenous  PONV Risk Score and Plan: 2 and Ondansetron and Midazolam  Airway Management Planned: Oral ETT  Additional Equipment: None  Intra-op Plan:   Post-operative Plan: Extubation in OR  Informed Consent: I have reviewed the patients History and Physical, chart, labs and discussed the procedure including the risks, benefits and alternatives for the proposed anesthesia with the patient or authorized representative who has indicated his/her understanding and acceptance.   Dental advisory given  Plan Discussed with: CRNA and Surgeon  Anesthesia Plan Comments:         Anesthesia Quick Evaluation

## 2017-10-29 ENCOUNTER — Encounter (HOSPITAL_COMMUNITY): Payer: Self-pay | Admitting: Surgery

## 2018-02-03 DIAGNOSIS — M25572 Pain in left ankle and joints of left foot: Secondary | ICD-10-CM | POA: Diagnosis not present

## 2018-02-03 DIAGNOSIS — M1611 Unilateral primary osteoarthritis, right hip: Secondary | ICD-10-CM | POA: Diagnosis not present

## 2018-04-24 DIAGNOSIS — S81811A Laceration without foreign body, right lower leg, initial encounter: Secondary | ICD-10-CM | POA: Diagnosis not present

## 2018-04-24 DIAGNOSIS — L0390 Cellulitis, unspecified: Secondary | ICD-10-CM | POA: Diagnosis not present

## 2018-04-29 DIAGNOSIS — S81811D Laceration without foreign body, right lower leg, subsequent encounter: Secondary | ICD-10-CM | POA: Diagnosis not present

## 2018-04-29 DIAGNOSIS — L089 Local infection of the skin and subcutaneous tissue, unspecified: Secondary | ICD-10-CM | POA: Diagnosis not present

## 2018-09-06 DIAGNOSIS — S0181XA Laceration without foreign body of other part of head, initial encounter: Secondary | ICD-10-CM | POA: Diagnosis not present

## 2018-10-07 DIAGNOSIS — F419 Anxiety disorder, unspecified: Secondary | ICD-10-CM | POA: Diagnosis not present

## 2018-10-15 DIAGNOSIS — J111 Influenza due to unidentified influenza virus with other respiratory manifestations: Secondary | ICD-10-CM | POA: Diagnosis not present

## 2018-10-23 DIAGNOSIS — H6593 Unspecified nonsuppurative otitis media, bilateral: Secondary | ICD-10-CM | POA: Diagnosis not present

## 2018-10-23 DIAGNOSIS — J0100 Acute maxillary sinusitis, unspecified: Secondary | ICD-10-CM | POA: Diagnosis not present

## 2019-01-19 DIAGNOSIS — M1711 Unilateral primary osteoarthritis, right knee: Secondary | ICD-10-CM | POA: Diagnosis not present

## 2019-02-25 DIAGNOSIS — M545 Low back pain: Secondary | ICD-10-CM | POA: Diagnosis not present

## 2019-02-25 DIAGNOSIS — M1711 Unilateral primary osteoarthritis, right knee: Secondary | ICD-10-CM | POA: Diagnosis not present

## 2019-03-18 DIAGNOSIS — M1611 Unilateral primary osteoarthritis, right hip: Secondary | ICD-10-CM | POA: Diagnosis not present

## 2019-03-25 DIAGNOSIS — M169 Osteoarthritis of hip, unspecified: Secondary | ICD-10-CM | POA: Diagnosis not present

## 2019-04-20 DIAGNOSIS — Z01812 Encounter for preprocedural laboratory examination: Secondary | ICD-10-CM | POA: Diagnosis not present

## 2019-04-22 DIAGNOSIS — M1611 Unilateral primary osteoarthritis, right hip: Secondary | ICD-10-CM | POA: Diagnosis not present

## 2019-04-28 ENCOUNTER — Ambulatory Visit: Payer: Self-pay | Admitting: Family Medicine

## 2019-04-30 DIAGNOSIS — Z96651 Presence of right artificial knee joint: Secondary | ICD-10-CM | POA: Diagnosis not present

## 2019-04-30 DIAGNOSIS — M1611 Unilateral primary osteoarthritis, right hip: Secondary | ICD-10-CM | POA: Diagnosis not present

## 2019-06-03 DIAGNOSIS — M25561 Pain in right knee: Secondary | ICD-10-CM | POA: Diagnosis not present

## 2019-06-09 DIAGNOSIS — K1321 Leukoplakia of oral mucosa, including tongue: Secondary | ICD-10-CM | POA: Diagnosis not present

## 2019-06-12 DIAGNOSIS — K1321 Leukoplakia of oral mucosa, including tongue: Secondary | ICD-10-CM | POA: Diagnosis not present

## 2019-08-07 DIAGNOSIS — Z96641 Presence of right artificial hip joint: Secondary | ICD-10-CM | POA: Diagnosis not present

## 2019-08-18 DIAGNOSIS — M6281 Muscle weakness (generalized): Secondary | ICD-10-CM | POA: Diagnosis not present

## 2019-08-18 DIAGNOSIS — Z96641 Presence of right artificial hip joint: Secondary | ICD-10-CM | POA: Diagnosis not present

## 2020-01-27 DIAGNOSIS — S91331A Puncture wound without foreign body, right foot, initial encounter: Secondary | ICD-10-CM | POA: Diagnosis not present

## 2020-01-29 ENCOUNTER — Other Ambulatory Visit: Payer: Self-pay

## 2020-01-29 ENCOUNTER — Encounter: Payer: Self-pay | Admitting: Emergency Medicine

## 2020-01-29 ENCOUNTER — Ambulatory Visit
Admission: EM | Admit: 2020-01-29 | Discharge: 2020-01-29 | Disposition: A | Discharge status: Home / Self Care | Payer: BC Managed Care – PPO

## 2020-01-29 ENCOUNTER — Ambulatory Visit (INDEPENDENT_AMBULATORY_CARE_PROVIDER_SITE_OTHER): Payer: BC Managed Care – PPO

## 2020-01-29 DIAGNOSIS — M79674 Pain in right toe(s): Secondary | ICD-10-CM

## 2020-01-29 DIAGNOSIS — S91301A Unspecified open wound, right foot, initial encounter: Secondary | ICD-10-CM | POA: Diagnosis not present

## 2020-01-29 MED ORDER — DOXYCYCLINE HYCLATE 100 MG PO CAPS: 100.0000 mg | ORAL_CAPSULE | Freq: Two times a day (BID) | ORAL | 0 refills | Status: AC

## 2020-01-29 NOTE — ED Triage Notes (Signed)
Pt here after stepping on nail 2 days ago and was seen at [facility] given antibiotics and tetanus; pt now having increased pain in great toe on right foot with redness; pt sts taking antibiotics

## 2020-01-29 NOTE — Discharge Instructions (Signed)
Xray negative for "gas bubbles" that would make me worry about infection to the fascia. Continue ciprofloxacin. Start doxycycline. Warm compress, elevation of the toe. Take naproxen 440mg  twice a day for possible inflammatory causes as well. If symptoms worsening in the next 3-4 days, unable to move toes, fever, go to the ED for further evaluation.

## 2020-01-29 NOTE — ED Provider Notes (Signed)
[facility]    CSN: [id number] Arrival date & time: 01/29/20  9983      History   Chief Complaint Chief Complaint  Patient presents with  . Wound Check    HPI Corey Phelps is a 59 y.o. male.   59 year old male comes in for worsening symptoms after stepping on a nail 2 days ago. Was wearing shoes when incident occurred, states nail was removed completely and was intact. Was given ciprofloxacin and tetanus. States now with increased pain to the dorsal aspect of the toe with erythema, warmth. Denies fever. Has been taking cipro as directed.      Past Medical History:  Diagnosis Date  . Chronic cholecystitis   . Claustrophobia   . History of kidney stones   . Numbness    left leg to toes   . Wears glasses     Patient Active Problem List   Diagnosis Date Noted  . Primary osteoarthritis of left hip 04/24/2016    Past Surgical History:  Procedure Laterality Date  . CERVICAL SPINE SURGERY    . CHOLECYSTECTOMY N/A 10/28/2017   Procedure: LAPAROSCOPIC CHOLECYSTECTOMY;  Surgeon: Abigail Miyamoto, MD;  Location: [facility] OR;  Service: General;  Laterality: N/A;  . ESOPHAGEAL DILATION    . ROTATOR CUFF REPAIR Right   . TOTAL HIP ARTHROPLASTY Left 04/24/2016   Procedure: LEFT TOTAL HIP ARTHROPLASTY ANTERIOR APPROACH;  Surgeon: Marcene Corning, MD;  Location: [facility] OR;  Service: Orthopedics;  Laterality: Left;       Home Medications    Prior to Admission medications   Medication Sig Start Date End Date Taking? Authorizing Provider  ciprofloxacin (CIPRO) 500 MG tablet Take 500 mg by mouth 2 (two) times daily.   Yes [provider]  traMADol (ULTRAM) 50 MG tablet Take by mouth every 6 (six) hours as needed.   Yes [provider]  acetaminophen (TYLENOL) 500 MG tablet Take 1,000 mg by mouth every 8 (eight) hours as needed.    [provider]  calcium carbonate (TUMS - DOSED IN MG ELEMENTAL CALCIUM) 500 MG chewable tablet Chew 1 tablet by mouth  as needed for indigestion or heartburn.    [provider]  doxycycline (VIBRAMYCIN) 100 MG capsule Take 1 capsule (100 mg total) by mouth 2 (two) times daily. 01/29/20   Cathie Hoops, Maye Parkinson V, PA-C  ibuprofen (ADVIL,MOTRIN) 200 MG tablet Take 400 mg by mouth every 8 (eight) hours as needed for mild pain.    [provider]  metroNIDAZOLE (METROCREAM) 0.75 % cream Apply 1 application topically daily as needed (for rosacea).  02/07/16   [provider]  oxyCODONE (OXY IR/ROXICODONE) 5 MG immediate release tablet Take 1-2 tablets (5-10 mg total) by mouth every 6 (six) hours as needed for moderate pain or severe pain. Patient not taking: Reported on 01/29/2020 10/28/17   Abigail Miyamoto, MD    Family History History reviewed. No pertinent family history.  Social History Social History   Tobacco Use  . Smoking status: Never Smoker  . Smokeless tobacco: Current User    Types: Snuff, Chew  Substance Use Topics  . Alcohol use: Yes    Alcohol/week: 2.0 - 4.0 standard drinks    Types: 2 - 4 Cans of beer per week    Comment: occas.   . Drug use: No     Allergies   Erythromycin   Review of Systems Review of Systems  Reason unable to perform ROS: See HPI as above.  Physical Exam Triage Vital Signs ED Triage Vitals  Enc Vitals Group     BP 01/29/20 0848 (!) 145/88     Pulse Rate 01/29/20 0848 82     Resp 01/29/20 0848 18     Temp 01/29/20 0848 (!) 97.4 F (36.3 C)     Temp Source 01/29/20 0848 Oral     SpO2 01/29/20 0848 97 %     Weight --      Height --      Head Circumference --      Peak Flow --      Pain Score 01/29/20 0849 5     Pain Loc --      Pain Edu? --      Excl. in GC? --    No data found.  Updated Vital Signs BP (!) 145/88 (BP Location: Left Arm)   Pulse 82   Temp (!) 97.4 F (36.3 C) (Oral)   Resp 18   SpO2 97%   Physical Exam Constitutional:      General: He is not in acute distress.    Appearance: Normal appearance. He is  well-developed. He is not toxic-appearing or diaphoretic.  HENT:     Head: Normocephalic and atraumatic.  Eyes:     Conjunctiva/sclera: Conjunctivae normal.     Pupils: Pupils are equal, round, and reactive to light.  Pulmonary:     Effort: Pulmonary effort is normal. No respiratory distress.     Comments: Speaking in full sentences without difficulty Musculoskeletal:     Cervical back: Normal range of motion and neck supple.     Comments: Puncture wound to the right distal plantar surface, between 1st-2nd MTP. Swelling, erythema, warmth to the dorsal aspect of 1st MTP. 1+ pitting edema to the distal foot, does not extend to the ankle. Decreased flexion. Sensation intact. Pedal pulse 2+  Skin:    General: Skin is warm and dry.  Neurological:     Mental Status: He is alert and oriented to person, place, and time.      UC Treatments / Results  Labs (all labs ordered are listed, but only abnormal results are displayed) Labs Reviewed - No data to display  EKG   Radiology DG Foot Complete Right  Result Date: 01/29/2020 CLINICAL DATA:  Recent puncture wound. Swelling and redness around the first MTP joint. EXAM: RIGHT FOOT COMPLETE - 3+ VIEW COMPARISON:  None. FINDINGS: No acute fracture or dislocation. No bony destruction or periosteal reaction. Mild first MTP joint space narrowing with small marginal osteophytes. Bone mineralization is normal. Tiny plantar enthesophyte. Medial and plantar soft tissue swelling around the first MTP joint. No radiopaque foreign body identified. Vascular calcifications. IMPRESSION: 1. Soft tissue swelling around the first MTP joint. No acute osseous abnormality. 2. Mild first MTP joint osteoarthritis. Electronically Signed   By: Obie Dredge M.D.   On: 01/29/2020 09:51    Procedures Procedures (including critical care time)  Medications Ordered in UC Medications - No data to display  Initial Impression / Assessment and Plan / UC Course  I have  reviewed the triage vital signs and the nursing notes.  Pertinent labs & imaging results that were available during my care of the patient were reviewed by me and considered in my medical decision making (see chart for details).    Low suspicion for septic joint at this time given no significant pain with palpation, still with ROM. Given erythema with swelling overlaying 1st MTP, worsening despite being on cipro,  will obtain xray for further evaluation.   Discussed xray results with patient. No need for ED referral at this time. Will have patient continue ciprofloxacin and add doxycycline to cover for S aureus/MRSA. Will also have patient start NSAID to cover for inflammatory response. Strict return precaution given.   Final Clinical Impressions(s) / UC Diagnoses   Final diagnoses:  Great toe pain, right    ED Prescriptions    Medication Sig Dispense Auth. Provider   doxycycline (VIBRAMYCIN) 100 MG capsule Take 1 capsule (100 mg total) by mouth 2 (two) times daily. 14 capsule Ok Edwards, PA-C     PDMP not reviewed this encounter.   Ok Edwards, PA-C 01/29/20 1020

## 2020-07-12 DIAGNOSIS — N486 Induration penis plastica: Secondary | ICD-10-CM | POA: Diagnosis not present

## 2020-07-12 DIAGNOSIS — Z125 Encounter for screening for malignant neoplasm of prostate: Secondary | ICD-10-CM | POA: Diagnosis not present

## 2020-07-12 DIAGNOSIS — N5201 Erectile dysfunction due to arterial insufficiency: Secondary | ICD-10-CM | POA: Diagnosis not present

## 2020-07-12 DIAGNOSIS — N5314 Retrograde ejaculation: Secondary | ICD-10-CM | POA: Diagnosis not present
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
## 2020-12-19 DIAGNOSIS — M19042 Primary osteoarthritis, left hand: Secondary | ICD-10-CM | POA: Diagnosis not present

## 2020-12-19 DIAGNOSIS — M79641 Pain in right hand: Secondary | ICD-10-CM | POA: Diagnosis not present

## 2020-12-19 DIAGNOSIS — M720 Palmar fascial fibromatosis [Dupuytren]: Secondary | ICD-10-CM | POA: Diagnosis not present

## 2020-12-19 DIAGNOSIS — M19041 Primary osteoarthritis, right hand: Secondary | ICD-10-CM | POA: Diagnosis not present

## 2020-12-19 DIAGNOSIS — M79642 Pain in left hand: Secondary | ICD-10-CM | POA: Diagnosis not present

## 2020-12-25 DIAGNOSIS — L509 Urticaria, unspecified: Secondary | ICD-10-CM | POA: Diagnosis not present

## 2020-12-25 DIAGNOSIS — K591 Functional diarrhea: Secondary | ICD-10-CM | POA: Diagnosis not present

## 2020-12-26 DIAGNOSIS — S9701XA Crushing injury of right ankle, initial encounter: Secondary | ICD-10-CM | POA: Diagnosis not present

## 2020-12-29 DIAGNOSIS — M25571 Pain in right ankle and joints of right foot: Secondary | ICD-10-CM | POA: Diagnosis not present

## 2021-01-02 ENCOUNTER — Ambulatory Visit (HOSPITAL_COMMUNITY)
Admission: RE | Admit: 2021-01-02 | Discharge: 2021-01-02 | Disposition: A | Discharge status: Home / Self Care | Payer: BC Managed Care – PPO | Source: Ambulatory Visit | Attending: Orthopaedic Surgery | Admitting: Orthopaedic Surgery

## 2021-01-02 ENCOUNTER — Other Ambulatory Visit (HOSPITAL_COMMUNITY): Payer: Self-pay | Admitting: Orthopaedic Surgery

## 2021-01-02 ENCOUNTER — Other Ambulatory Visit: Payer: Self-pay

## 2021-01-02 DIAGNOSIS — M79604 Pain in right leg: Secondary | ICD-10-CM | POA: Diagnosis not present

## 2021-01-02 DIAGNOSIS — M25571 Pain in right ankle and joints of right foot: Secondary | ICD-10-CM | POA: Diagnosis not present

## 2021-01-06 DIAGNOSIS — M79661 Pain in right lower leg: Secondary | ICD-10-CM | POA: Diagnosis not present

## 2021-01-25 DIAGNOSIS — M720 Palmar fascial fibromatosis [Dupuytren]: Secondary | ICD-10-CM | POA: Diagnosis not present

## 2021-01-25 DIAGNOSIS — M13849 Other specified arthritis, unspecified hand: Secondary | ICD-10-CM | POA: Diagnosis not present

## 2021-01-25 DIAGNOSIS — M79641 Pain in right hand: Secondary | ICD-10-CM | POA: Diagnosis not present

## 2021-06-30 DIAGNOSIS — M25511 Pain in right shoulder: Secondary | ICD-10-CM | POA: Diagnosis not present

## 2021-06-30 DIAGNOSIS — M25512 Pain in left shoulder: Secondary | ICD-10-CM | POA: Diagnosis not present

## 2021-08-02 DIAGNOSIS — J069 Acute upper respiratory infection, unspecified: Secondary | ICD-10-CM | POA: Diagnosis not present

## 2021-08-02 DIAGNOSIS — R059 Cough, unspecified: Secondary | ICD-10-CM | POA: Diagnosis not present

## 2021-08-02 DIAGNOSIS — R0981 Nasal congestion: Secondary | ICD-10-CM | POA: Diagnosis not present

## 2021-08-07 DIAGNOSIS — M7542 Impingement syndrome of left shoulder: Secondary | ICD-10-CM | POA: Diagnosis not present

## 2021-08-07 DIAGNOSIS — M7541 Impingement syndrome of right shoulder: Secondary | ICD-10-CM | POA: Diagnosis not present
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
## 2024-01-04 NOTE — Nursing Note (Signed)
   01/04/24 0600  Missed Session   Note Type Contact Note  Missed Therapy Reason Unavailable (Pt currently in the ED at this time. Will follow-up once pt arrives to unit.)  OT Missed Minutes 0
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
## 2024-04-15 ENCOUNTER — Encounter (HOSPITAL_BASED_OUTPATIENT_CLINIC_OR_DEPARTMENT_OTHER): Payer: Self-pay

## 2024-04-15 ENCOUNTER — Emergency Department (HOSPITAL_BASED_OUTPATIENT_CLINIC_OR_DEPARTMENT_OTHER)
Admission: EM | Admit: 2024-04-15 | Discharge: 2024-04-15 | Disposition: A | Discharge status: Home / Self Care | Source: Ambulatory Visit | Attending: Emergency Medicine | Admitting: Emergency Medicine

## 2024-04-15 ENCOUNTER — Other Ambulatory Visit: Payer: Self-pay

## 2024-04-15 DIAGNOSIS — J392 Other diseases of pharynx: Secondary | ICD-10-CM | POA: Diagnosis not present

## 2024-04-15 DIAGNOSIS — D72828 Other elevated white blood cell count: Secondary | ICD-10-CM | POA: Diagnosis not present

## 2024-04-15 DIAGNOSIS — R21 Rash and other nonspecific skin eruption: Secondary | ICD-10-CM | POA: Insufficient documentation

## 2024-04-15 LAB — COMPREHENSIVE METABOLIC PANEL WITH GFR
ALT: 26 U/L (ref 0–44)
AST: 30 U/L (ref 15–41)
Albumin: 4.1 g/dL (ref 3.5–5.0)
Alkaline Phosphatase: 101 U/L (ref 38–126)
Anion gap: 11 (ref 5–15)
BUN: 16 mg/dL (ref 8–23)
CO2: 24 mmol/L (ref 22–32)
Calcium: 9.3 mg/dL (ref 8.9–10.3)
Chloride: 106 mmol/L (ref 98–111)
Creatinine, Ser: 1.09 mg/dL (ref 0.61–1.24)
GFR, Estimated: >60 mL/min (ref >60)
Glucose, Bld: 93 mg/dL (ref 70–99)
Potassium: 3.8 mmol/L (ref 3.5–5.1)
Sodium: 141 mmol/L (ref 135–145)
Total Bilirubin: 0.4 mg/dL (ref 0.0–1.2)
Total Protein: 6.6 g/dL (ref 6.5–8.1)

## 2024-04-15 LAB — CBC WITH DIFFERENTIAL/PLATELET
Abs Immature Granulocytes: 0.06 K/uL (ref 0.00–0.07)
Basophils Absolute: 0.1 K/uL (ref 0.0–0.1)
Basophils Relative: 0 %
Eosinophils Absolute: 0.9 K/uL — ABNORMAL HIGH (ref 0.0–0.5)
Eosinophils Relative: 7 %
HCT: 45.6 % (ref 39.0–52.0)
Hemoglobin: 15.3 g/dL (ref 13.0–17.0)
Immature Granulocytes: 1 %
Lymphocytes Relative: 11 %
Lymphs Abs: 1.5 K/uL (ref 0.7–4.0)
MCH: 31.4 pg (ref 26.0–34.0)
MCHC: 33.6 g/dL (ref 30.0–36.0)
MCV: 93.4 fL (ref 80.0–100.0)
Monocytes Absolute: 0.8 K/uL (ref 0.1–1.0)
Monocytes Relative: 6 %
Neutro Abs: 9.7 K/uL — ABNORMAL HIGH (ref 1.7–7.7)
Neutrophils Relative %: 75 %
Platelets: 245 K/uL (ref 150–400)
RBC: 4.88 MIL/uL (ref 4.22–5.81)
RDW: 13.2 % (ref 11.5–15.5)
WBC: 13 K/uL — ABNORMAL HIGH (ref 4.0–10.5)
nRBC: 0 % (ref 0.0–0.2)

## 2024-04-15 MED ORDER — PREDNISONE 20 MG PO TABS
ORAL_TABLET | ORAL | 0 refills | Status: AC
Start: 2024-04-16 — End: ?

## 2024-04-15 MED ORDER — LORATADINE 10 MG PO TABS: 10.0000 mg | ORAL_TABLET | Freq: Every day | ORAL | 0 refills | Status: AC

## 2024-04-15 MED ORDER — HYDROXYZINE HCL 25 MG PO TABS: 25.0000 mg | ORAL_TABLET | Freq: Every evening | ORAL | 0 refills | Status: AC | PRN

## 2024-04-15 MED ORDER — PREDNISONE 50 MG PO TABS
60.0000 mg | ORAL_TABLET | Freq: Once | ORAL | Status: AC
  Administered 2024-04-15: 60 mg via ORAL
  Filled 2024-04-15: qty 1

## 2024-04-15 NOTE — ED Triage Notes (Addendum)
 Patient reports having an allergic reaction for over a week. He has seen urgent care and they gave him a steroid shot and triamcinolone cream. He still has a large amount of hives and says it has not gotten better. Today he reports his tongue feeling itchy and had a swelling feeling. He has no airway compromise and is able to control secretions. Reports no change in soaps or detergents. 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

## 2024-04-15 NOTE — ED Notes (Signed)
 Pt given discharge instructions. Prescriptions discussed. Opportunities given for questions. IV removed. Pt stable at time of discharge.
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
## 2024-04-15 NOTE — Discharge Instructions (Signed)
 Please read and follow all provided instructions.  Your diagnoses today include:  1. Rash     Tests performed today include: Complete blood cell count: White cell count was high, including the allergic cells Complete metabolic panel: Was normal Testing for [facility] spotted fever and Lyme disease was sent Vital signs. See below for your results today.   Medications prescribed:  Prednisone  - steroid medicine   It is best to take this medication in the morning to prevent sleeping problems. If you are diabetic, monitor your blood sugar closely and stop taking Prednisone  if blood sugar is over 300. Take with food to prevent stomach upset.   Loratadine  and hydroxyzine : Antihistamine medications for itching, take as prescribed  Take any prescribed medications only as directed.  Home care instructions:  Follow any educational materials contained in this packet.  BE VERY CAREFUL not to take multiple medicines containing Tylenol  (also called acetaminophen ). Doing so can lead to an overdose which can damage your liver and cause liver failure and possibly death.   Follow-up instructions: Please follow-up with your primary care provider in the next 7 days for further evaluation of your symptoms.   Return instructions:  Please return to the Emergency Department if you experience worsening symptoms.  Return if you have difficulty breathing or swallowing, severe lip swelling or tongue swelling, fever or persistent vomiting Please return if you have any other emergent concerns.  Additional Information:  Your vital signs today were: BP 136/80   Pulse 63   Temp 98 F (36.7 C) (Oral)   Resp 18   SpO2 100%  If your blood pressure (BP) was elevated above 135/85 this visit, please have this repeated by your doctor within one month. --------------

## 2024-04-16 LAB — LYME DISEASE SEROLOGY W/REFLEX: Lyme Total Antibody EIA: NEGATIVE
